# Patient Record
Sex: Female | Born: 1945 | Race: White | Hispanic: No | Marital: Married | Smoking: Never smoker
Health system: Southern US, Community
[De-identification: ages and names within clinical notes are randomized; demographics above are authoritative.]

## PROBLEM LIST (undated history)

## (undated) DIAGNOSIS — I10 Essential (primary) hypertension: Secondary | ICD-10-CM

## (undated) DIAGNOSIS — F329 Major depressive disorder, single episode, unspecified: Secondary | ICD-10-CM

## (undated) DIAGNOSIS — Z9889 Other specified postprocedural states: Secondary | ICD-10-CM

## (undated) DIAGNOSIS — E785 Hyperlipidemia, unspecified: Secondary | ICD-10-CM

## (undated) DIAGNOSIS — M1990 Unspecified osteoarthritis, unspecified site: Secondary | ICD-10-CM

## (undated) DIAGNOSIS — K589 Irritable bowel syndrome without diarrhea: Secondary | ICD-10-CM

## (undated) DIAGNOSIS — I739 Peripheral vascular disease, unspecified: Secondary | ICD-10-CM

## (undated) DIAGNOSIS — F419 Anxiety disorder, unspecified: Secondary | ICD-10-CM

## (undated) DIAGNOSIS — M4800 Spinal stenosis, site unspecified: Secondary | ICD-10-CM

## (undated) DIAGNOSIS — F32A Depression, unspecified: Secondary | ICD-10-CM

## (undated) DIAGNOSIS — K219 Gastro-esophageal reflux disease without esophagitis: Secondary | ICD-10-CM

## (undated) DIAGNOSIS — N3090 Cystitis, unspecified without hematuria: Secondary | ICD-10-CM

## (undated) DIAGNOSIS — R112 Nausea with vomiting, unspecified: Secondary | ICD-10-CM

## (undated) DIAGNOSIS — Z8719 Personal history of other diseases of the digestive system: Secondary | ICD-10-CM

## (undated) HISTORY — DX: Essential (primary) hypertension: I10

## (undated) HISTORY — DX: Major depressive disorder, single episode, unspecified: F32.9

## (undated) HISTORY — DX: Irritable bowel syndrome, unspecified: K58.9

## (undated) HISTORY — PX: CHOLECYSTECTOMY: SHX55

## (undated) HISTORY — DX: Hyperlipidemia, unspecified: E78.5

## (undated) HISTORY — DX: Gastro-esophageal reflux disease without esophagitis: K21.9

## (undated) HISTORY — DX: Depression, unspecified: F32.A

## (undated) HISTORY — DX: Cystitis, unspecified without hematuria: N30.90

## (undated) HISTORY — PX: FRACTURE SURGERY: SHX138

## (undated) HISTORY — DX: Unspecified osteoarthritis, unspecified site: M19.90

## (undated) HISTORY — DX: Spinal stenosis, site unspecified: M48.00

## (undated) HISTORY — PX: TONSILLECTOMY: SUR1361

---

## 1979-03-04 HISTORY — PX: ABDOMINAL HYSTERECTOMY: SHX81

## 1987-03-04 HISTORY — PX: CERVICAL SPINE SURGERY: SHX589

## 1992-03-03 HISTORY — PX: CERVICAL SPINE SURGERY: SHX589

## 1995-03-04 HISTORY — PX: BACK SURGERY: SHX140

## 1997-06-30 ENCOUNTER — Other Ambulatory Visit: Admission: RE | Admit: 1997-06-30 | Discharge: 1997-06-30 | Payer: Self-pay | Admitting: Internal Medicine

## 1998-03-03 HISTORY — PX: BACK SURGERY: SHX140

## 1998-11-29 ENCOUNTER — Ambulatory Visit (HOSPITAL_COMMUNITY): Admission: RE | Admit: 1998-11-29 | Discharge: 1998-11-29 | Payer: Self-pay | Admitting: Specialist

## 1998-11-29 ENCOUNTER — Encounter: Payer: Self-pay | Admitting: Specialist

## 1998-12-13 ENCOUNTER — Encounter: Payer: Self-pay | Admitting: Specialist

## 1998-12-13 ENCOUNTER — Ambulatory Visit (HOSPITAL_COMMUNITY): Admission: RE | Admit: 1998-12-13 | Discharge: 1998-12-13 | Payer: Self-pay | Admitting: Specialist

## 1999-01-04 ENCOUNTER — Encounter: Payer: Self-pay | Admitting: Specialist

## 1999-01-04 ENCOUNTER — Ambulatory Visit (HOSPITAL_COMMUNITY): Admission: RE | Admit: 1999-01-04 | Discharge: 1999-01-04 | Payer: Self-pay | Admitting: Specialist

## 1999-01-18 ENCOUNTER — Encounter: Payer: Self-pay | Admitting: Specialist

## 1999-01-22 ENCOUNTER — Observation Stay (HOSPITAL_COMMUNITY): Admission: RE | Admit: 1999-01-22 | Discharge: 1999-01-23 | Payer: Self-pay | Admitting: Specialist

## 1999-01-22 ENCOUNTER — Encounter: Payer: Self-pay | Admitting: Specialist

## 1999-04-29 ENCOUNTER — Emergency Department (HOSPITAL_COMMUNITY): Admission: EM | Admit: 1999-04-29 | Discharge: 1999-04-29 | Payer: Self-pay | Admitting: Emergency Medicine

## 1999-04-29 ENCOUNTER — Encounter: Payer: Self-pay | Admitting: Emergency Medicine

## 1999-06-20 ENCOUNTER — Encounter: Payer: Self-pay | Admitting: Internal Medicine

## 1999-06-20 ENCOUNTER — Encounter: Admission: RE | Admit: 1999-06-20 | Discharge: 1999-06-20 | Payer: Self-pay | Admitting: Internal Medicine

## 2000-03-03 HISTORY — PX: BACK SURGERY: SHX140

## 2000-10-02 ENCOUNTER — Ambulatory Visit (HOSPITAL_COMMUNITY): Admission: RE | Admit: 2000-10-02 | Discharge: 2000-10-02 | Payer: Self-pay | Admitting: Gastroenterology

## 2000-10-08 ENCOUNTER — Encounter: Admission: RE | Admit: 2000-10-08 | Discharge: 2000-10-08 | Payer: Self-pay | Admitting: Gastroenterology

## 2000-10-08 ENCOUNTER — Encounter: Payer: Self-pay | Admitting: Gastroenterology

## 2000-10-09 ENCOUNTER — Ambulatory Visit (HOSPITAL_COMMUNITY): Admission: RE | Admit: 2000-10-09 | Discharge: 2000-10-09 | Payer: Self-pay | Admitting: Gastroenterology

## 2000-10-23 ENCOUNTER — Encounter: Payer: Self-pay | Admitting: Internal Medicine

## 2000-10-23 ENCOUNTER — Encounter: Admission: RE | Admit: 2000-10-23 | Discharge: 2000-10-23 | Payer: Self-pay | Admitting: Internal Medicine

## 2001-02-10 ENCOUNTER — Encounter: Payer: Self-pay | Admitting: Specialist

## 2001-02-11 ENCOUNTER — Inpatient Hospital Stay (HOSPITAL_COMMUNITY): Admission: EM | Admit: 2001-02-11 | Discharge: 2001-02-12 | Payer: Self-pay | Admitting: Specialist

## 2001-03-03 HISTORY — PX: BACK SURGERY: SHX140

## 2001-05-26 ENCOUNTER — Encounter: Payer: Self-pay | Admitting: Specialist

## 2001-05-26 ENCOUNTER — Inpatient Hospital Stay (HOSPITAL_COMMUNITY): Admission: RE | Admit: 2001-05-26 | Discharge: 2001-06-01 | Payer: Self-pay | Admitting: Specialist

## 2001-06-08 ENCOUNTER — Inpatient Hospital Stay (HOSPITAL_COMMUNITY): Admission: EM | Admit: 2001-06-08 | Discharge: 2001-06-10 | Payer: Self-pay | Admitting: Emergency Medicine

## 2001-06-08 ENCOUNTER — Encounter: Payer: Self-pay | Admitting: Emergency Medicine

## 2001-06-09 ENCOUNTER — Encounter: Payer: Self-pay | Admitting: Internal Medicine

## 2001-10-27 ENCOUNTER — Encounter: Admission: RE | Admit: 2001-10-27 | Discharge: 2001-10-27 | Payer: Self-pay | Admitting: Internal Medicine

## 2001-10-27 ENCOUNTER — Encounter: Payer: Self-pay | Admitting: Internal Medicine

## 2002-04-28 ENCOUNTER — Ambulatory Visit (HOSPITAL_COMMUNITY): Admission: RE | Admit: 2002-04-28 | Discharge: 2002-04-28 | Payer: Self-pay | Admitting: Internal Medicine

## 2003-02-08 ENCOUNTER — Emergency Department (HOSPITAL_COMMUNITY): Admission: EM | Admit: 2003-02-08 | Discharge: 2003-02-09 | Payer: Self-pay | Admitting: Emergency Medicine

## 2006-03-03 HISTORY — PX: COLONOSCOPY: SHX174

## 2006-07-31 LAB — HM COLONOSCOPY

## 2009-03-03 HISTORY — PX: OTHER SURGICAL HISTORY: SHX169

## 2010-08-02 ENCOUNTER — Other Ambulatory Visit (INDEPENDENT_AMBULATORY_CARE_PROVIDER_SITE_OTHER): Payer: Self-pay | Admitting: General Surgery

## 2010-08-02 ENCOUNTER — Encounter (HOSPITAL_COMMUNITY)
Admission: RE | Admit: 2010-08-02 | Discharge: 2010-08-02 | Disposition: A | Discharge status: Home / Self Care | Payer: PRIVATE HEALTH INSURANCE | Source: Ambulatory Visit | Attending: General Surgery | Admitting: General Surgery

## 2010-08-02 ENCOUNTER — Ambulatory Visit (HOSPITAL_COMMUNITY)
Admission: RE | Admit: 2010-08-02 | Discharge: 2010-08-02 | Disposition: A | Discharge status: Home / Self Care | Payer: PRIVATE HEALTH INSURANCE | Source: Ambulatory Visit | Attending: General Surgery | Admitting: General Surgery

## 2010-08-02 DIAGNOSIS — Z01810 Encounter for preprocedural cardiovascular examination: Secondary | ICD-10-CM | POA: Insufficient documentation

## 2010-08-02 DIAGNOSIS — K8020 Calculus of gallbladder without cholecystitis without obstruction: Secondary | ICD-10-CM

## 2010-08-02 DIAGNOSIS — G4730 Sleep apnea, unspecified: Secondary | ICD-10-CM | POA: Insufficient documentation

## 2010-08-02 DIAGNOSIS — Z01812 Encounter for preprocedural laboratory examination: Secondary | ICD-10-CM | POA: Insufficient documentation

## 2010-08-02 DIAGNOSIS — Z01818 Encounter for other preprocedural examination: Secondary | ICD-10-CM | POA: Insufficient documentation

## 2010-08-02 LAB — DIFFERENTIAL
Lymphs Abs: 1.5 10*3/uL (ref 0.7–4.0)
Monocytes Relative: 7 % (ref 3–12)
Neutro Abs: 3.6 10*3/uL (ref 1.7–7.7)
Neutrophils Relative %: 66 % (ref 43–77)

## 2010-08-02 LAB — COMPREHENSIVE METABOLIC PANEL
AST: 23 U/L (ref 0–37)
BUN: 19 mg/dL (ref 6–23)
CO2: 32 mEq/L (ref 19–32)
Chloride: 96 mEq/L (ref 96–112)
Creatinine, Ser: 0.98 mg/dL (ref 0.4–1.2)
GFR calc non Af Amer: 57 mL/min — ABNORMAL LOW (ref >60)
Total Bilirubin: 0.5 mg/dL (ref 0.3–1.2)

## 2010-08-02 LAB — CBC
HCT: 35.7 % — ABNORMAL LOW (ref 36.0–46.0)
Hemoglobin: 12.8 g/dL (ref 12.0–15.0)
MCH: 31.8 pg (ref 26.0–34.0)
MCV: 88.8 fL (ref 78.0–100.0)
RBC: 4.02 MIL/uL (ref 3.87–5.11)

## 2010-08-02 LAB — SURGICAL PCR SCREEN: MRSA, PCR: NEGATIVE

## 2010-08-05 ENCOUNTER — Ambulatory Visit (HOSPITAL_COMMUNITY)
Admission: RE | Admit: 2010-08-05 | Discharge: 2010-08-05 | Disposition: A | Discharge status: Home / Self Care | Payer: PRIVATE HEALTH INSURANCE | Source: Ambulatory Visit | Attending: General Surgery | Admitting: General Surgery

## 2010-08-05 ENCOUNTER — Other Ambulatory Visit (INDEPENDENT_AMBULATORY_CARE_PROVIDER_SITE_OTHER): Payer: Self-pay | Admitting: General Surgery

## 2010-08-05 DIAGNOSIS — E785 Hyperlipidemia, unspecified: Secondary | ICD-10-CM | POA: Insufficient documentation

## 2010-08-05 DIAGNOSIS — F3289 Other specified depressive episodes: Secondary | ICD-10-CM | POA: Insufficient documentation

## 2010-08-05 DIAGNOSIS — Z79899 Other long term (current) drug therapy: Secondary | ICD-10-CM | POA: Insufficient documentation

## 2010-08-05 DIAGNOSIS — Z01812 Encounter for preprocedural laboratory examination: Secondary | ICD-10-CM | POA: Insufficient documentation

## 2010-08-05 DIAGNOSIS — IMO0002 Reserved for concepts with insufficient information to code with codable children: Secondary | ICD-10-CM | POA: Insufficient documentation

## 2010-08-05 DIAGNOSIS — F329 Major depressive disorder, single episode, unspecified: Secondary | ICD-10-CM | POA: Insufficient documentation

## 2010-08-05 DIAGNOSIS — Z01818 Encounter for other preprocedural examination: Secondary | ICD-10-CM | POA: Insufficient documentation

## 2010-08-05 DIAGNOSIS — K8010 Calculus of gallbladder with chronic cholecystitis without obstruction: Secondary | ICD-10-CM | POA: Insufficient documentation

## 2010-08-05 DIAGNOSIS — M129 Arthropathy, unspecified: Secondary | ICD-10-CM | POA: Insufficient documentation

## 2010-08-05 DIAGNOSIS — K589 Irritable bowel syndrome without diarrhea: Secondary | ICD-10-CM | POA: Insufficient documentation

## 2010-08-05 DIAGNOSIS — I10 Essential (primary) hypertension: Secondary | ICD-10-CM | POA: Insufficient documentation

## 2010-08-05 DIAGNOSIS — K219 Gastro-esophageal reflux disease without esophagitis: Secondary | ICD-10-CM | POA: Insufficient documentation

## 2010-08-05 DIAGNOSIS — Z7982 Long term (current) use of aspirin: Secondary | ICD-10-CM | POA: Insufficient documentation

## 2010-08-06 NOTE — Op Note (Signed)
[name], [name] NO.:  [id number]  MEDICAL RECORD NO.:  [id number]  LOCATION:  [facility]                         FACILITY:  [facility]  PHYSICIAN:  Juanetta Gosling, MDDATE OF BIRTH:  [birth year]  DATE OF PROCEDURE:  08/05/2010 DATE OF DISCHARGE:  08/05/2010                              OPERATIVE REPORT   PREOPERATIVE DIAGNOSIS:  Symptomatic cholelithiasis.  POSTOPERATIVE DIAGNOSIS:  Symptomatic cholelithiasis.  PROCEDURE:  Laparoscopic cholecystectomy.  SURGEON:  Juanetta Gosling, MD  ASSISTANT:  None.  ANESTHESIA:  General.  SUPERVISING ANESTHESIOLOGIST:  Zenon Mayo, MD  SPECIMENS:  Gallbladder contents to pathology.  ESTIMATED BLOOD LOSS:  Minimal.  COMPLICATIONS:  None.  DRAINS:  None.  DISPOSITION:  To recovery room in stable condition.  INDICATIONS:  This is a 65 year old female who recently on vacation on Mother's Day weekend she ate some [place] food and shortly after this developed some right upper quadrant pain.  She was evaluated at [facility] and at that point had epigastric tenderness with white blood cell count of 13, normal liver function tests except for an AST of 52.  Ultrasound which I reviewed the results of multiple mobile gallstones, no pericholecystic fluid, gallbladder wall thickening or sonographic Murphy sign.  She has continued to have attacks since then I saw her at the end of May we discussed laparoscopic cholecystectomy and the risks and benefits associated with that procedure.    After informed consent was obtained, the patient was taken to the operating room.  She was administered 1 g of intravenous cefazolin.  Sequential compression devices were placed.  Prior to induction of anesthesia, she was then placed under general anesthesia without complication.  Her abdomen was prepped and draped in standard sterile surgical fashion.  A surgical time-out was then performed.  I infiltrated 0.25% Marcaine below her  umbilicus.  I made in a vertical incision with an 11 blade and carried this down to her fascia.  Her fascia was entered sharply.  Her peritoneum was entered bluntly.  I placed a 0 Vicryl pursestring suture through the fascia and Hasson trocar was then introduced and the abdomen was insufflated to 15  mmHg pressure.  Three further 5-mm ports were placed in the epigastrium in right side of the abdomen under direct vision after infiltration with local anesthetic without complications.  Her gallbladder was then retracted cephalad.  Her duodenum and omentum were noted to be adherent to her gallbladder.  This was taken down bluntly without injury.  Her gallbladder was then retracted cephalad and lateral.  I dissected the triangle of Calot.  The critical view of safety was obtained.  I could see the common bile duct throughout its entirety as well.  I then clipped the duct three times, divided this.  I treated the artery in a similar fashion.  The gallbladder was then removed from the liver bed without any spillage.  This was placed in an EndoCatch bag and removed from the umbilicus.  I then obtained hemostasis.  Irrigation was performed.  I then removed the Hasson trocar.  I tied this down obliterating the defect.  I viewed this from the epigastric port.  There was no evidence of an entry  injury.  I then desufflated the abdomen and removed all trocars.  These were closed with 4-0 Monocryl.  Dermabond was placed over this.  She tolerated this well, was extubated in the operating room and transferred to the recovery room in stable condition.     Juanetta Gosling, MD     MCW/MEDQ  D:  08/05/2010  T:  08/06/2010  Job:  [id number]  cc:   Larina Earthly, M.D.  Electronically Signed by Emelia Loron MD on 08/06/2010 03:27:32 PM

## 2010-08-15 ENCOUNTER — Encounter (INDEPENDENT_AMBULATORY_CARE_PROVIDER_SITE_OTHER): Payer: Self-pay | Admitting: General Surgery

## 2010-08-26 ENCOUNTER — Ambulatory Visit (INDEPENDENT_AMBULATORY_CARE_PROVIDER_SITE_OTHER): Payer: PRIVATE HEALTH INSURANCE | Admitting: General Surgery

## 2010-08-26 ENCOUNTER — Encounter (INDEPENDENT_AMBULATORY_CARE_PROVIDER_SITE_OTHER): Payer: Self-pay | Admitting: General Surgery

## 2010-08-26 DIAGNOSIS — Z09 Encounter for follow-up examination after completed treatment for conditions other than malignant neoplasm: Secondary | ICD-10-CM

## 2010-08-26 NOTE — Progress Notes (Signed)
Subjective:     Patient ID: Shelia [name], female   DOB: [birth year], 65 y.o.   MRN: [id number]    There were no vitals taken for this visit.    HPIThis is a 65 year old female who I saw at the end of May for symptomatic cholelithiasis. She has since undergone a laparoscopic cholecystectomy on August 05, 2010. She reports no problems postoperatively. She's been advancing her diet without any difficulty. She reports no recurrence of her prior pain. She's been having normal bowel movements after having some constipation postoperatively.Pathology shows chronic cholecystitis and cholelithiasis   Review of Systems     Objective:   Physical ExamHer laparoscopic incisions are all well healed without any evidence of infection     Assessment:    s/p lap chole for symptomatic cholelithiasis   Plan:     Resume regular diet. No activity restrictions. Return to see me as needed

## 2012-05-20 ENCOUNTER — Other Ambulatory Visit: Payer: Self-pay | Admitting: Nurse Practitioner

## 2012-06-17 ENCOUNTER — Ambulatory Visit: Payer: Self-pay | Admitting: Nurse Practitioner

## 2012-06-30 ENCOUNTER — Encounter: Payer: Self-pay | Admitting: *Deleted

## 2012-07-02 ENCOUNTER — Ambulatory Visit: Payer: Self-pay | Admitting: Nurse Practitioner

## 2012-07-30 ENCOUNTER — Telehealth: Payer: Self-pay | Admitting: Nurse Practitioner

## 2012-07-30 ENCOUNTER — Ambulatory Visit: Payer: Self-pay | Admitting: Nurse Practitioner

## 2012-07-30 NOTE — Telephone Encounter (Signed)
Patients daughter has recently been dx with a rare condition and her parents need to be checked to find out what their levels are because they are carriers. She needs her Ferritin  levels checked needs to find out if we will do this and if she needs to be fasting.

## 2012-07-30 NOTE — Telephone Encounter (Signed)
SEE NOTE BELOW AND CHART IN YOUR OFFICE. PER ASHLEY DO NOT NEED TO BE FASTING FOR THIS. PLEASE ADVISE AND WILL LET PATIENT KNOW. SUE

## 2012-07-31 NOTE — Telephone Encounter (Signed)
Of course we can check this.  She does not need to be fasting.  Does she know the name of the condition so I can be sure nothing else needs to be checked?  I will enter orders with response.

## 2012-08-02 NOTE — Telephone Encounter (Signed)
Spoke with pt who reports her daughter has hemachromatosis, or too much iron in her blood. Pt told she can have labwork drawn at her [facility] and she does not need to fast. Pt has appt 08-17-12. Pt agreeable.

## 2012-08-17 ENCOUNTER — Ambulatory Visit (INDEPENDENT_AMBULATORY_CARE_PROVIDER_SITE_OTHER): Payer: PRIVATE HEALTH INSURANCE | Admitting: Nurse Practitioner

## 2012-08-17 ENCOUNTER — Encounter: Payer: Self-pay | Admitting: Nurse Practitioner

## 2012-08-17 VITALS — BP 102/68 | HR 70 | Resp 16 | Ht 67.0 in | Wt 138.6 lb

## 2012-08-17 DIAGNOSIS — Z0000 Encounter for general adult medical examination without abnormal findings: Secondary | ICD-10-CM

## 2012-08-17 DIAGNOSIS — Z01419 Encounter for gynecological examination (general) (routine) without abnormal findings: Secondary | ICD-10-CM

## 2012-08-17 DIAGNOSIS — Z23 Encounter for immunization: Secondary | ICD-10-CM

## 2012-08-17 LAB — POCT URINALYSIS DIPSTICK
Bilirubin, UA: NEGATIVE
Blood, UA: NEGATIVE
Nitrite, UA: NEGATIVE

## 2012-08-17 MED ORDER — ESTROGENS CONJUGATED 0.3 MG PO TABS: 0.3000 mg | ORAL_TABLET | Freq: Every day | ORAL | Status: DC

## 2012-08-17 MED ORDER — ESTROGENS, CONJUGATED 0.625 MG/GM VA CREA: TOPICAL_CREAM | VAGINAL | Status: DC

## 2012-08-17 NOTE — Progress Notes (Signed)
67 y.o. G2P2 Married Caucasian Fe here for annual exam.  Premarin vaginal cream has really helped with dyspareunia.  Daughter age 103 new diagnosis with hemachromatosis.  No LMP recorded. Patient has had a hysterectomy.          Sexually active: yes  The current method of family planning is post menopausal status.    Exercising: yes  Home exercise routine includes walking 1 mile daily hrs per day. Smoker:  no  Health Maintenance: Pap:  2013 MMG:  08/03/12 normal Colonoscopy:  07/2006   due for this year BMD:  09/2006 scheduled for September TD:  8/04;   Needs TDaP today Labs: Hgb-13.3, urine today    reports that she has never smoked. She does not have any smokeless tobacco history on file. She reports that she does not drink alcohol or use illicit drugs.  Past Medical History  Diagnosis Date  . Hypertension   . DJD (degenerative joint disease)   . Depression   . GERD (gastroesophageal reflux disease)   . Hyperlipidemia   . Irritable bowel syndrome (IBS)   . Cystitis   . Spinal stenosis     Past Surgical History  Procedure Laterality Date  . Cervical spine surgery  1989    c-4,c-5  . Cervical spine surgery  1994    c-4,c-5  . Back surgery  1997    l-5,s-1  . Back surgery  2000    l-5,s-1  . Back surgery  2002    l-5,s-1  . Back surgery  2003    l-5,s-2  . Hemorrhoid banding  2011  . Cholecystectomy      lap  . Abdominal hysterectomy  1981    TVH  . Colonoscopy  2008    polyps    Current Outpatient Prescriptions  Medication Sig Dispense Refill  . aspirin 81 MG tablet Take 81 mg by mouth daily.      . celecoxib (CELEBREX) 200 MG capsule Take 200 mg by mouth 2 (two) times daily.        . Cetirizine HCl (ZYRTEC ALLERGY PO) Take by mouth as needed.      . conjugated estrogens (PREMARIN) vaginal cream Place vaginally 2 (two) times a week.  42.5 g  3  . fish oil-omega-3 fatty acids 1000 MG capsule Take 2 g by mouth daily.        Marland Kitchen losartan-hydrochlorothiazide (HYZAAR)  100-25 MG per tablet Take 1 tablet by mouth daily.        . Meth-Hyo-M Bl-Na Phos-Ph Sal (URIBEL) 118 MG CAPS Take by mouth.        . metoprolol-hydrochlorothiazide (LOPRESSOR HCT) 100-25 MG per tablet Take 1 tablet by mouth daily.      . Multiple Vitamins-Minerals (MULTIVITAMIN PO) Take by mouth daily.      . potassium chloride SA (K-DUR,KLOR-CON) 20 MEQ tablet Take 20 mEq by mouth 2 (two) times daily.        . Throat Lozenges (ZINC + VITAMIN C MT) Use as directed in the mouth or throat daily.      Marland Kitchen triamterene-hydrochlorothiazide (DYAZIDE) 37.5-25 MG per capsule Take 1 capsule by mouth every morning.      . trimethoprim (TRIMPEX) 100 MG tablet Take 100 mg by mouth 2 (two) times daily.        Marland Kitchen zolpidem (AMBIEN) 10 MG tablet Take 10 mg by mouth at bedtime as needed.        Marland Kitchen estrogens, conjugated, (PREMARIN) 0.3 MG tablet Take 1 tablet (  0.3 mg total) by mouth daily. Take daily for 21 days then do not take for 7 days.  90 tablet  3   No current facility-administered medications for this visit.    Family History  Problem Relation Age of Onset  . Heart disease Mother   . Heart disease Father     ROS:  Pertinent items are noted in HPI.  Otherwise, a comprehensive ROS was negative.  Exam:   BP 102/68  Pulse 70  Resp 16  Ht 5\' 7"  (1.702 m)  Wt 138 lb 9.6 oz (62.869 kg)  BMI 21.7 kg/m2 Height: 5\' 7"  (170.2 cm)  Ht Readings from Last 3 Encounters:  08/17/12 5\' 7"  (1.702 m)    General appearance: alert, cooperative and appears stated age Head: Normocephalic, without obvious abnormality, atraumatic Neck: no adenopathy, supple, symmetrical, trachea midline and thyroid normal to inspection and palpation Lungs: clear to auscultation bilaterally Breasts: normal appearance, no masses or tenderness Heart: regular rate and rhythm Abdomen: soft, non-tender; no masses,  no organomegaly Extremities: extremities normal, atraumatic, no cyanosis or edema Skin: Skin color, texture, turgor normal.  No rashes or lesions Lymph nodes: Cervical, supraclavicular, and axillary nodes normal. No abnormal inguinal nodes palpated Neurologic: Grossly normal   Pelvic: External genitalia:  no lesions              Urethra:  normal appearing urethra with no masses, tenderness or lesions              Bartholin's and Skene's: normal                 Vagina: atrophic appearing vagina with pale color and no discharge, no lesions              Cervix: absent              Pap taken: no Bimanual Exam:  Uterus:  uterus absent              Adnexa: no mass, fullness, tenderness               Rectovaginal: Confirms               Anus:  normal sphincter tone, no lesions  A:  Well Woman with normal exam  S/P TVH with AP repair 1981 secondary to AUB  History of spinal stenosis  Update TDaP today  Daughter with new diagnosis of hemochromatosis  P:   Pap smear as per guidelines   Mammogram is scheduled for this month  TDaP given today  Refill Premarin tabs and vaginal cream  Discussed potential risks of HRT including DVT, CVA, and cancer, etc.   She will consult with PCP about labs she needs for hemochromatosis  counseled on breast self exam, adequate intake of calcium and vitamin D,   diet and exercise, Kegel's exercises return annually or prn  An After Visit Summary was printed and given to the patient.

## 2012-08-17 NOTE — Patient Instructions (Addendum)

## 2012-08-18 ENCOUNTER — Telehealth: Payer: Self-pay | Admitting: Nurse Practitioner

## 2012-08-18 NOTE — Telephone Encounter (Signed)
RX written in error per patient. Was written for Premarin and this costs too much. Pateint wants Estrace 42.5 is what she normally takes and copay is $25. [employer]

## 2012-08-18 NOTE — Telephone Encounter (Signed)
Please advise 

## 2012-08-19 NOTE — Progress Notes (Signed)
Encounter reviewed by Dr. Brook Silva.  

## 2012-08-19 NOTE — Telephone Encounter (Signed)
Pt states that she has used the Estrace cream in the past. Pt states there is a $25.00 difference between the two meds. Please advise.

## 2012-08-19 NOTE — Telephone Encounter (Signed)
Per all her notes on epic with medication history for past 3 years Premarin cream is listed.  Is she requesting change due to cost, because they all are about the same

## 2012-08-19 NOTE — Telephone Encounter (Signed)
LVM for pt to return my call in regards to med refill/change.

## 2012-08-20 NOTE — Telephone Encounter (Signed)
Unable to find chart.  Pt would like to change due to cost.  Please advise if OK to change to Estrace.

## 2012-08-22 NOTE — Telephone Encounter (Signed)
Ok to change

## 2012-08-23 MED ORDER — ESTRADIOL 0.1 MG/GM VA CREA: TOPICAL_CREAM | VAGINAL | Status: DC

## 2012-08-23 NOTE — Telephone Encounter (Signed)
RX for Estrace Cream sent.

## 2012-10-06 ENCOUNTER — Other Ambulatory Visit: Payer: Self-pay

## 2012-12-14 ENCOUNTER — Other Ambulatory Visit: Payer: Self-pay | Admitting: Nurse Practitioner

## 2013-01-06 ENCOUNTER — Other Ambulatory Visit: Payer: Self-pay

## 2013-01-24 ENCOUNTER — Telehealth: Payer: Self-pay | Admitting: Nurse Practitioner

## 2013-01-24 NOTE — Telephone Encounter (Signed)
Spoke with pt to advise her to go to [employer] and print out medication Tier info so Shelia [name] will know what might be a cheaper alternative for her. Pt will fax to [place] or she will call company to get recommendations.

## 2013-01-24 NOTE — Telephone Encounter (Signed)
Patient calling re: Estrace to tier 3 as of 03/03/13. Patient is calling for lower-cost alternatives but the insurance company did not recommend any.

## 2013-01-25 NOTE — Telephone Encounter (Signed)
Both of these is not a good option for her.  The transdermal is just estrogen but the dose is higher than what she is on.  The other is a combination of estrogen and progesterone which is also higher than her dose and she doesn't need both hormones -  because she had a hysterectomy.  My best advise is to taper off of her current therapy and then discontinue.  We also discussed this at [facility] - that is was time to maybe come off ERT.

## 2013-01-25 NOTE — Telephone Encounter (Signed)
Spoke with pt who has info on two different Tier 1 options. Estradiol transdermal and estradiol norethindrone accet. are both Tier 1. Pt didn't know if either would be right for her. Please advise.

## 2013-01-26 NOTE — Telephone Encounter (Signed)
Spoke with patient. She is agreeable to plan and will discuss further with Lauro Franklin, FNP at [facility].

## 2013-07-05 ENCOUNTER — Encounter (INDEPENDENT_AMBULATORY_CARE_PROVIDER_SITE_OTHER): Payer: Self-pay | Admitting: General Surgery

## 2013-07-05 ENCOUNTER — Ambulatory Visit (INDEPENDENT_AMBULATORY_CARE_PROVIDER_SITE_OTHER): Payer: No Typology Code available for payment source | Admitting: General Surgery

## 2013-07-05 VITALS — BP 124/80 | HR 76 | Temp 97.8°F | Ht 68.0 in | Wt 141.6 lb

## 2013-07-05 DIAGNOSIS — D171 Benign lipomatous neoplasm of skin and subcutaneous tissue of trunk: Secondary | ICD-10-CM

## 2013-07-05 DIAGNOSIS — D1779 Benign lipomatous neoplasm of other sites: Secondary | ICD-10-CM

## 2013-07-05 NOTE — Progress Notes (Signed)
Subjective:     Patient ID: Shelia [name], female   DOB: 1945-03-15, 68 y.o.   MRN: [id number]  HPI A 68 year old female on a well from a cholecystectomy. Over the last several months she has noted a cyst on her left flank. This is somewhat irritating to her. There's been no change in size in the last 6 months. She has no history of any infection the drainage associated with this. She was seen by her primary care physician he comes in today to have this evaluated.  Review of Systems     Objective:   Physical Exam 3 cm mobile nontender soft mass left flank c/w lipoma    Assessment:     Left flank lipoma     Plan:     Clinically this appears to be a lipoma. It has no concerning features. I told her I think does not need to be removed unless really symptomatic or period of rapid growth neither of which are true right now.  She will call me back if this is concerning.

## 2013-07-11 ENCOUNTER — Encounter: Payer: Self-pay | Admitting: Nurse Practitioner

## 2013-08-18 ENCOUNTER — Ambulatory Visit: Payer: PRIVATE HEALTH INSURANCE | Admitting: Nurse Practitioner

## 2013-09-01 ENCOUNTER — Ambulatory Visit (INDEPENDENT_AMBULATORY_CARE_PROVIDER_SITE_OTHER): Payer: No Typology Code available for payment source | Admitting: Nurse Practitioner

## 2013-09-01 ENCOUNTER — Encounter: Payer: Self-pay | Admitting: Nurse Practitioner

## 2013-09-01 VITALS — BP 110/60 | HR 68 | Resp 16 | Ht 67.0 in | Wt 140.0 lb

## 2013-09-01 DIAGNOSIS — Z0000 Encounter for general adult medical examination without abnormal findings: Secondary | ICD-10-CM

## 2013-09-01 DIAGNOSIS — N949 Unspecified condition associated with female genital organs and menstrual cycle: Secondary | ICD-10-CM

## 2013-09-01 DIAGNOSIS — Z01419 Encounter for gynecological examination (general) (routine) without abnormal findings: Secondary | ICD-10-CM

## 2013-09-01 DIAGNOSIS — N3090 Cystitis, unspecified without hematuria: Secondary | ICD-10-CM

## 2013-09-01 DIAGNOSIS — R102 Pelvic and perineal pain: Secondary | ICD-10-CM

## 2013-09-01 LAB — POCT URINALYSIS DIPSTICK
Bilirubin, UA: NEGATIVE
Glucose, UA: NEGATIVE
Ketones, UA: NEGATIVE
NITRITE UA: NEGATIVE
PH UA: 5
Protein, UA: NEGATIVE
UROBILINOGEN UA: NEGATIVE

## 2013-09-01 MED ORDER — URIBEL 118 MG PO CAPS: 118.0000 mg | ORAL_CAPSULE | Freq: Two times a day (BID) | ORAL | Status: DC

## 2013-09-01 MED ORDER — ESTROGENS CONJUGATED 0.3 MG PO TABS: ORAL_TABLET | ORAL | Status: DC

## 2013-09-01 MED ORDER — NITROFURANTOIN MONOHYD MACRO 100 MG PO CAPS: 100.0000 mg | ORAL_CAPSULE | Freq: Two times a day (BID) | ORAL | Status: DC

## 2013-09-01 NOTE — Progress Notes (Signed)
68 y.o. G2P2 Married Caucasian Fe here for annual exam.  Recently noted an uncomfortable area on the left vulva and vaginal wall internally with SA.  Discomfort for about 3 months.  Does not seem to be related to dryness as she is using lubrication.  Now is having the symptoms of 'swelling' externally and has to remove underwear when she gets home to get relief.  She has at times noted this with sitting down.  Symptoms seem to be within the vagina and radiates to the external left labia and groin.  Denies any urinary or bowel changes and no relief with habits.  Patient's last menstrual period was 08/02/1979.          Sexually active: Yes The current method of family planning is status post hysterectomy secondary to endometriosis  Exercising: Yes.    Walking daily Smoker:  no  Health Maintenance: Pap:  2013 - Normal MMG:  08/2013 - Normal - per pt Colonoscopy:  07/2006 normal - repeat in 5 years BMD:   2008 will get at Dr. Radene Gunning TDaP:  08/2012 Shingles vaccine: not done Labs: PCP   UA: RBC= Small, WBC=Trace.   reports that she has never smoked. She has never used smokeless tobacco. She reports that she does not drink alcohol or use illicit drugs.  Past Medical History  Diagnosis Date  . Hypertension   . DJD (degenerative joint disease)   . Depression   . GERD (gastroesophageal reflux disease)   . Hyperlipidemia   . Irritable bowel syndrome (IBS)   . Cystitis   . Spinal stenosis     Past Surgical History  Procedure Laterality Date  . Cervical spine surgery  1989    c-4,c-5  . Cervical spine surgery  1994    c-4,c-5  . Back surgery  1997    l-5,s-1  . Back surgery  2000    l-5,s-1  . Back surgery  2002    l-5,s-1  . Back surgery  2003    l-5,s-2  . Hemorrhoid banding  2011  . Cholecystectomy      lap  . Abdominal hysterectomy  1981    TVH  . Colonoscopy  2008    polyps    Current Outpatient Prescriptions  Medication Sig Dispense Refill  . aspirin 81 MG tablet Take 81  mg by mouth daily.      . celecoxib (CELEBREX) 200 MG capsule Take 200 mg by mouth 2 (two) times daily.        . cholecalciferol (VITAMIN D) 1000 UNITS tablet Take 1,000 Units by mouth daily.      Marland Kitchen estrogens, conjugated, (PREMARIN) 0.3 MG tablet Take daily  90 tablet  3  . fish oil-omega-3 fatty acids 1000 MG capsule Take 2 g by mouth daily.        Marland Kitchen losartan-hydrochlorothiazide (HYZAAR) 100-25 MG per tablet Take 1 tablet by mouth daily.        . Meth-Hyo-M Bl-Na Phos-Ph Sal (URIBEL) 118 MG CAPS Take 1 capsule (118 mg total) by mouth 2 (two) times daily.  180 capsule  3  . Multiple Vitamins-Minerals (MULTIVITAMIN PO) Take by mouth daily.      . potassium chloride SA (K-DUR,KLOR-CON) 20 MEQ tablet Take 20 mEq by mouth 2 (two) times daily.        . Throat Lozenges (ZINC + VITAMIN C MT) Use as directed in the mouth or throat daily.      Marland Kitchen triamterene-hydrochlorothiazide (DYAZIDE) 37.5-25 MG per capsule Take 1  capsule by mouth every morning.      . zolpidem (AMBIEN) 10 MG tablet Take 10 mg by mouth at bedtime as needed.        . nitrofurantoin, macrocrystal-monohydrate, (MACROBID) 100 MG capsule Take 1 capsule (100 mg total) by mouth 2 (two) times daily.  14 capsule  0   No current facility-administered medications for this visit.    Family History  Problem Relation Age of Onset  . Heart disease Mother   . Heart disease Father   . Hemochromatosis Daughter     ROS:  Pertinent items are noted in HPI.  Otherwise, a comprehensive ROS was negative.  Exam:   BP 110/60  Pulse 68  Resp 16  Ht 5\' 7"  (1.702 m)  Wt 140 lb (63.504 kg)  BMI 21.92 kg/m2  LMP 08/02/1979 Height: 5\' 7"  (170.2 cm)  Ht Readings from Last 3 Encounters:  09/01/13 5\' 7"  (1.702 m)  07/05/13 5\' 8"  (1.727 m)  08/17/12 5\' 7"  (1.702 m)    General appearance: alert, cooperative and appears stated age Head: Normocephalic, without obvious abnormality, atraumatic Neck: no adenopathy, supple, symmetrical, trachea midline and  thyroid normal to inspection and palpation Lungs: clear to auscultation bilaterally Breasts: normal appearance, no masses or tenderness Heart: regular rate and rhythm Abdomen: soft, non-tender; no masses,  no organomegaly Extremities: extremities normal, atraumatic, no cyanosis or edema Skin: Skin color, texture, turgor normal. No rashes or lesions Lymph nodes: Cervical, supraclavicular, and axillary nodes normal. No abnormal inguinal nodes palpated Neurologic: Grossly normal   Pelvic: External genitalia:  no lesions, there is a small vein in the groin but not large or tender.              Urethra:  normal appearing urethra with no masses, tenderness or lesions              Bartholin's and Skene's: normal                 Vagina: normal appearing vagina with normal color and discharge, no lesions, unable to illicit same pain but she admits it feels different.              Cervix: absent              Pap taken: No. Bimanual Exam:  Uterus:  uterus absent              Adnexa: no mass, fullness, tenderness               Rectovaginal: Confirms               Anus:  normal sphincter tone, no lesions  A:  Well Woman with normal exam  Postmenopausal on ERT  S/P TVH with A&P repair 1981 secondary to AUB  Left vaginal wall pain ? Etiology  R/O UTI  P:   Reviewed health and wellness pertinent to exam  Pap smear not taken today  Mammogram is due 6/16  Follow with [facility], started on Macrobid 100 mg BID #14  Refill Premarin 0.3 mg for a year  Counseled on risk with DVT, CVA, cancer, etc  Will get PUS and follow looking closely at left sidewall and ovary  Counseled on breast self exam, mammography screening, use and side effects of HRT, adequate intake of calcium and vitamin D, diet and exercise, Kegel's exercises return annually or prn  An After Visit Summary was printed and given to the patient.

## 2013-09-01 NOTE — Patient Instructions (Addendum)

## 2013-09-02 LAB — URINALYSIS, MICROSCOPIC ONLY
Bacteria, UA: NONE SEEN
Casts: NONE SEEN
Crystals: NONE SEEN

## 2013-09-03 LAB — URINE CULTURE
Colony Count: NO GROWTH
Organism ID, Bacteria: NO GROWTH

## 2013-09-06 NOTE — Progress Notes (Signed)
Encounter reviewed by Dr. Brook Silva.  

## 2013-09-07 ENCOUNTER — Telehealth: Payer: Self-pay | Admitting: Obstetrics and Gynecology

## 2013-09-07 NOTE — Telephone Encounter (Signed)
Left message for patient to call back. Need to go over benefits and schedule PUS °

## 2013-09-07 NOTE — Telephone Encounter (Signed)
Spoke with patient. Advised that per benefit quote received, she will be responsible for $50 copay for PUS. Patient agreeable. Scheduled PUS. Advised patient of 72 hour cancellation policy and $080 cancellation fee. Patient agreeable.

## 2013-09-22 ENCOUNTER — Ambulatory Visit (INDEPENDENT_AMBULATORY_CARE_PROVIDER_SITE_OTHER): Payer: No Typology Code available for payment source

## 2013-09-22 ENCOUNTER — Encounter: Payer: Self-pay | Admitting: Obstetrics and Gynecology

## 2013-09-22 ENCOUNTER — Ambulatory Visit (INDEPENDENT_AMBULATORY_CARE_PROVIDER_SITE_OTHER): Payer: No Typology Code available for payment source | Admitting: Obstetrics and Gynecology

## 2013-09-22 VITALS — BP 124/80 | Wt 144.0 lb

## 2013-09-22 DIAGNOSIS — Z1211 Encounter for screening for malignant neoplasm of colon: Secondary | ICD-10-CM

## 2013-09-22 DIAGNOSIS — N949 Unspecified condition associated with female genital organs and menstrual cycle: Secondary | ICD-10-CM

## 2013-09-22 DIAGNOSIS — K649 Unspecified hemorrhoids: Secondary | ICD-10-CM

## 2013-09-22 DIAGNOSIS — R1032 Left lower quadrant pain: Secondary | ICD-10-CM

## 2013-09-22 DIAGNOSIS — R102 Pelvic and perineal pain: Secondary | ICD-10-CM

## 2013-09-22 DIAGNOSIS — R109 Unspecified abdominal pain: Secondary | ICD-10-CM

## 2013-09-22 MED ORDER — ESTROGENS, CONJUGATED 0.625 MG/GM VA CREA: 1.0000 | TOPICAL_CREAM | Freq: Every day | VAGINAL | Status: DC

## 2013-09-22 NOTE — Patient Instructions (Signed)
Please contact us back if you have continued pain after seeing Dr. Collene Mares and your surgeon.

## 2013-09-22 NOTE — Progress Notes (Signed)
Subjective  Patient is here today for pelvic ultrasound.   Status post total vaginal hysterectomy with anterior and posterior colporrhapy for endometriosis and prolapse.   Having vaginal pain radiating to the left groin.  Painful intercourse.  Has used vaginal estrogen cream but it is not paid by insurance.  Wants to try Premarin cream again with an online coupon.   Had mammogram in June - normal.  History of spinal stenosis and multiple prior back surgeries.   History of IBS and cystitis. Has hemorrhoids.  Objective  Pelvic ultrasound - Images and reports reviewed with patient.   Absent uterus.  Bilateral ovaries normal..  No free fluid.     Assessment  Vaginal and Left groin pain of unclear etiology. History of IBS. Atrophic vaginitis.  History of spinal stenosis and multiple surgeries.   Plan  Will refer to Gastroenterology to evaluate for IBS and potential bowel/rectal cause of vaginal/groin pain.  Possibly due for colonoscopy. Rx for Premarin vaginal cream.  See EPIC orders.  Reviewed risks of breast cancer, DVT, PE, MI, stroke.  If GI evaluation is negative, I recommend patient follow up with her neurosurgeon.   15 minutes face to face time of which over 50% was spent in counseling.   After visit summary to patient.

## 2013-09-22 NOTE — Addendum Note (Signed)
Addended by: Michele Mcalpine on: 09/22/2013 11:56 AM   Modules accepted: Orders

## 2013-10-27 ENCOUNTER — Other Ambulatory Visit: Payer: Self-pay | Admitting: Dermatology

## 2014-01-02 ENCOUNTER — Encounter: Payer: Self-pay | Admitting: Obstetrics and Gynecology

## 2014-06-22 ENCOUNTER — Other Ambulatory Visit: Payer: Self-pay | Admitting: Nurse Practitioner

## 2014-06-22 NOTE — Telephone Encounter (Signed)
09/01/13 #90/3 rfs [facility] [address] [facility] and s/w patient she is wanting to get [facility]ills [facility] [address]. Told patient that she can [facility] and have them transfer her remaining refills to her pharmacy of choice. Patient is agreeable and will call us if she has any problems or issues.  Rx Denied through our system with a message to the pharmacy that patient will have rx transferred.  Routed to provider for review, encounter closed.

## 2014-09-05 ENCOUNTER — Ambulatory Visit: Payer: No Typology Code available for payment source | Admitting: Nurse Practitioner

## 2014-09-14 ENCOUNTER — Other Ambulatory Visit: Payer: Self-pay | Admitting: Nurse Practitioner

## 2014-09-15 NOTE — Telephone Encounter (Signed)
Medication refill request: premarin 0.3mg  Last [facility]:  09-01-13 Next [facility]: 11-01-14 Last MMG (if hormonal medication request): last mammo was 08-17-14 Refill authorized: pt was given 0.3mg  tabs at [facility] & saw Dr Quincy Simmonds that same month & was given rx for premarin vaginal cream. If pt is suppose to be using both please approve rx. Pt was called to see if she had recent mammo. Pt states she had one done 6/16. I called [facility] & had them fax mammo over. The report will be in your door for review.

## 2014-09-15 NOTE — Telephone Encounter (Signed)
Yes she is to have both oral and vaginal estrogen.

## 2014-11-01 ENCOUNTER — Ambulatory Visit: Payer: No Typology Code available for payment source | Admitting: Nurse Practitioner

## 2014-12-27 ENCOUNTER — Ambulatory Visit: Payer: No Typology Code available for payment source | Admitting: Nurse Practitioner

## 2015-03-09 ENCOUNTER — Ambulatory Visit: Payer: No Typology Code available for payment source | Admitting: Nurse Practitioner

## 2015-06-22 ENCOUNTER — Ambulatory Visit: Payer: No Typology Code available for payment source | Admitting: Nurse Practitioner

## 2015-08-29 DIAGNOSIS — Z1231 Encounter for screening mammogram for malignant neoplasm of breast: Secondary | ICD-10-CM | POA: Diagnosis not present

## 2015-08-31 ENCOUNTER — Ambulatory Visit: Payer: No Typology Code available for payment source | Admitting: Nurse Practitioner

## 2015-09-10 ENCOUNTER — Encounter: Payer: Self-pay | Admitting: Nurse Practitioner

## 2015-11-09 ENCOUNTER — Ambulatory Visit: Payer: No Typology Code available for payment source | Admitting: Nurse Practitioner

## 2015-12-09 DIAGNOSIS — Z23 Encounter for immunization: Secondary | ICD-10-CM | POA: Diagnosis not present

## 2015-12-28 ENCOUNTER — Ambulatory Visit: Payer: No Typology Code available for payment source | Admitting: Nurse Practitioner

## 2016-01-29 DIAGNOSIS — E784 Other hyperlipidemia: Secondary | ICD-10-CM | POA: Diagnosis not present

## 2016-01-29 DIAGNOSIS — N3090 Cystitis, unspecified without hematuria: Secondary | ICD-10-CM | POA: Diagnosis not present

## 2016-01-29 DIAGNOSIS — I10 Essential (primary) hypertension: Secondary | ICD-10-CM | POA: Diagnosis not present

## 2016-01-29 DIAGNOSIS — K219 Gastro-esophageal reflux disease without esophagitis: Secondary | ICD-10-CM | POA: Diagnosis not present

## 2016-01-29 DIAGNOSIS — J309 Allergic rhinitis, unspecified: Secondary | ICD-10-CM | POA: Diagnosis not present

## 2016-01-29 DIAGNOSIS — E785 Hyperlipidemia, unspecified: Secondary | ICD-10-CM | POA: Diagnosis not present

## 2016-01-29 DIAGNOSIS — I872 Venous insufficiency (chronic) (peripheral): Secondary | ICD-10-CM | POA: Diagnosis not present

## 2016-01-29 DIAGNOSIS — Z0000 Encounter for general adult medical examination without abnormal findings: Secondary | ICD-10-CM | POA: Diagnosis not present

## 2016-01-29 DIAGNOSIS — G4700 Insomnia, unspecified: Secondary | ICD-10-CM | POA: Diagnosis not present

## 2016-02-05 DIAGNOSIS — Z23 Encounter for immunization: Secondary | ICD-10-CM | POA: Diagnosis not present

## 2016-02-05 DIAGNOSIS — I872 Venous insufficiency (chronic) (peripheral): Secondary | ICD-10-CM | POA: Diagnosis not present

## 2016-02-05 DIAGNOSIS — Z1389 Encounter for screening for other disorder: Secondary | ICD-10-CM | POA: Diagnosis not present

## 2016-02-05 DIAGNOSIS — M1990 Unspecified osteoarthritis, unspecified site: Secondary | ICD-10-CM | POA: Diagnosis not present

## 2016-02-05 DIAGNOSIS — I10 Essential (primary) hypertension: Secondary | ICD-10-CM | POA: Diagnosis not present

## 2016-02-05 DIAGNOSIS — K589 Irritable bowel syndrome without diarrhea: Secondary | ICD-10-CM | POA: Diagnosis not present

## 2016-02-05 DIAGNOSIS — K219 Gastro-esophageal reflux disease without esophagitis: Secondary | ICD-10-CM | POA: Diagnosis not present

## 2016-02-05 DIAGNOSIS — G4709 Other insomnia: Secondary | ICD-10-CM | POA: Diagnosis not present

## 2016-02-05 DIAGNOSIS — J3089 Other allergic rhinitis: Secondary | ICD-10-CM | POA: Diagnosis not present

## 2016-02-05 DIAGNOSIS — M4800 Spinal stenosis, site unspecified: Secondary | ICD-10-CM | POA: Diagnosis not present

## 2016-02-05 DIAGNOSIS — Z0000 Encounter for general adult medical examination without abnormal findings: Secondary | ICD-10-CM | POA: Diagnosis not present

## 2016-02-05 DIAGNOSIS — Z6824 Body mass index (BMI) 24.0-24.9, adult: Secondary | ICD-10-CM | POA: Diagnosis not present

## 2016-02-05 DIAGNOSIS — E784 Other hyperlipidemia: Secondary | ICD-10-CM | POA: Diagnosis not present

## 2016-04-01 ENCOUNTER — Encounter: Payer: Self-pay | Admitting: Nurse Practitioner

## 2016-04-01 ENCOUNTER — Ambulatory Visit (INDEPENDENT_AMBULATORY_CARE_PROVIDER_SITE_OTHER): Payer: Medicare Other | Admitting: Nurse Practitioner

## 2016-04-01 ENCOUNTER — Telehealth: Payer: Self-pay | Admitting: Nurse Practitioner

## 2016-04-01 VITALS — BP 128/80 | HR 72 | Resp 12 | Ht 67.25 in | Wt 156.6 lb

## 2016-04-01 DIAGNOSIS — Z124 Encounter for screening for malignant neoplasm of cervix: Secondary | ICD-10-CM

## 2016-04-01 DIAGNOSIS — E559 Vitamin D deficiency, unspecified: Secondary | ICD-10-CM | POA: Diagnosis not present

## 2016-04-01 DIAGNOSIS — I83009 Varicose veins of unspecified lower extremity with ulcer of unspecified site: Secondary | ICD-10-CM

## 2016-04-01 DIAGNOSIS — Z01411 Encounter for gynecological examination (general) (routine) with abnormal findings: Secondary | ICD-10-CM

## 2016-04-01 DIAGNOSIS — G2581 Restless legs syndrome: Secondary | ICD-10-CM | POA: Diagnosis not present

## 2016-04-01 DIAGNOSIS — Z0000 Encounter for general adult medical examination without abnormal findings: Secondary | ICD-10-CM

## 2016-04-01 DIAGNOSIS — L97909 Non-pressure chronic ulcer of unspecified part of unspecified lower leg with unspecified severity: Secondary | ICD-10-CM

## 2016-04-01 MED ORDER — ESTRADIOL 0.1 MG/GM VA CREA: TOPICAL_CREAM | VAGINAL | 3 refills | Status: DC

## 2016-04-01 NOTE — Telephone Encounter (Signed)
Patient called and said, "Please tell Colletta [place] the sample name is Proctofoam HC. I was seen earlier and told to call with the information."

## 2016-04-01 NOTE — Progress Notes (Signed)
Patient ID: Shelia [name], female   DOB: May 05, 1945, 71 y.o.   MRN: [id number]  71 y.o. [id number] Married  Caucasian Fe here for annual exam.  Using olive oil prn for lubrication.  She does have RX for Premarin vaginal cream but is so expensive.  She is OK to try Estrace vaginal cream.  Also having pain and burning discomfort with vein on right leg.  She thinks also RLS.  She has now decided to retire at her birthday but is trying to figure out about Medicare coverage.  She brings her manual from [employer] to go over and see what is covered for vaginal estrogen.  Patient's last menstrual period was 08/02/1979.          Sexually active: Yes.    The current method of family planning is status post hysterectomy.    Exercising: Yes.    Brisk Walking Smoker:  no  Health Maintenance: Pap:  2013, Negative MMG: 08/29/15, 3D, Bi-Rads 1: Negative Colonoscopy:  07/2006 normal - repeat in 5 years BMD: long time ago per patient TDaP:  08/17/12 Shingles: Never Pneumonia:12/5/1 Hep C and HIV: Never Labs: Vit D done here   reports that she has never smoked. She has never used smokeless tobacco. She reports that she does not drink alcohol or use drugs.  Past Medical History:  Diagnosis Date  . Cystitis   . Depression   . DJD (degenerative joint disease)   . GERD (gastroesophageal reflux disease)   . Hyperlipidemia   . Hypertension   . Irritable bowel syndrome (IBS)   . Spinal stenosis     Past Surgical History:  Procedure Laterality Date  . ABDOMINAL HYSTERECTOMY  1981   TVH  . [place]   l-5,s-1  . BACK SURGERY  2000   l-5,s-1  . BACK SURGERY  2002   l-5,s-1  . BACK SURGERY  2003   l-5,s-2  . [place]   c-4,c-5  . [place]   c-4,c-5  . CHOLECYSTECTOMY     lap  . COLONOSCOPY  2008   polyps  . hemorrhoid banding  2011    Current Outpatient Prescriptions  Medication Sig Dispense Refill  . aspirin 81 MG tablet Take 81 mg by mouth  daily.    . cholecalciferol (VITAMIN D) 1000 UNITS tablet Take 1,000 Units by mouth daily.    . fish oil-omega-3 fatty acids 1000 MG capsule Take 2 g by mouth daily.      Marland Kitchen losartan-hydrochlorothiazide (HYZAAR) 100-25 MG per tablet Take 1 tablet by mouth daily.      . meloxicam (MOBIC) 7.5 MG tablet     . Multiple Vitamins-Minerals (MULTIVITAMIN PO) Take by mouth daily.    . potassium chloride SA (K-DUR,KLOR-CON) 20 MEQ tablet Take 20 mEq by mouth 2 (two) times daily.      . Throat Lozenges (ZINC + VITAMIN C MT) Use as directed in the mouth or throat daily.    Marland Kitchen triamterene-hydrochlorothiazide (DYAZIDE) 37.5-25 MG per capsule Take 1 capsule by mouth every morning.    . zolpidem (AMBIEN) 10 MG tablet Take 10 mg by mouth at bedtime as needed.      Marland Kitchen estradiol (ESTRACE) 0.1 MG/GM vaginal cream Use 1/2 g vaginally twice a week 42.5 g 3   No current facility-administered medications for this visit.     Family History  Problem Relation Age of Onset  . Heart disease Mother   . Heart disease  Father   . Hemochromatosis Daughter     ROS:  Pertinent items are noted in HPI.  Otherwise, a comprehensive ROS was negative.  Exam:   BP 128/80 (BP Location: Right Arm, Patient Position: Sitting, Cuff Size: Normal)   Pulse 72   Resp 12   Ht 5' 7.25" (1.708 m)   Wt 156 lb 9.6 oz (71 kg)   LMP 08/02/1979   BMI 24.35 kg/m  Height: 5' 7.25" (170.8 cm) Ht Readings from Last 3 Encounters:  04/01/16 5' 7.25" (1.708 m)  09/01/13 5\' 7"  (1.702 m)  07/05/13 5\' 8"  (1.727 m)    General appearance: alert, cooperative and appears stated age Head: Normocephalic, without obvious abnormality, atraumatic Neck: no adenopathy, supple, symmetrical, trachea midline and thyroid normal to inspection and palpation Lungs: clear to auscultation bilaterally Breasts: normal appearance, no masses or tenderness Heart: regular rate and rhythm Abdomen: soft, non-tender; no masses,  no organomegaly Extremities: extremities  normal, atraumatic, no cyanosis or edema.  There are prominent varicose veins right leg where she has pain. Skin: Skin color, texture, turgor normal. No rashes or lesions Lymph nodes: Cervical, supraclavicular, and axillary nodes normal. No abnormal inguinal nodes palpated Neurologic: Grossly normal   Pelvic: External genitalia:  no lesions              Urethra:  normal appearing urethra with no masses, tenderness or lesions              Bartholin's and Skene's: normal                 Vagina: normal appearing vagina with normal color and discharge, no lesions              Cervix: absent              Pap taken: No. Bimanual Exam:  Uterus:  uterus absent              Adnexa: no mass, fullness, tenderness               Rectovaginal: Confirms               Anus:  normal sphincter tone, no lesions  Chaperone present: yes  A:  Well Woman with normal exam  Postmenopausal will now stop ERT             S/P TVH with A&P repair 1981 secondary to AUB  Atrophic vaginitis  Varicose veins and ? RLS                        P:   Reviewed health and wellness pertinent to exam  Pap smear not done  Mammogram is due 07/2016  Will stop Premarin po  Will change Premarin vaginal cream to Estrace vaginal cream due to coverage.  Counseled on breast self exam, mammography screening, adequate intake of calcium and vitamin D, diet and exercise, Kegel's exercises return annually or prn  An After Visit Summary was printed and given to the patient.

## 2016-04-01 NOTE — Patient Instructions (Signed)

## 2016-04-02 MED ORDER — CLOBETASOL PROPIONATE 0.05 % EX OINT: 1.0000 "application " | TOPICAL_OINTMENT | Freq: Two times a day (BID) | CUTANEOUS | 0 refills | Status: DC

## 2016-04-02 NOTE — Progress Notes (Signed)
Encounter reviewed by Dr. Ouida Abeyta Amundson C. Silva.  

## 2016-04-02 NOTE — Telephone Encounter (Signed)
Pt was notified that Temovate was the cream she used in the past and not the Proctofoam for hemorrhoids on the vulvar dermatitis.  The new RX is sent to her pharmacy.  She is most appreciative.

## 2016-04-03 ENCOUNTER — Telehealth: Payer: Self-pay | Admitting: *Deleted

## 2016-04-03 NOTE — Telephone Encounter (Signed)
Patient insurance is calling regarding prior authorization for Estradiol 0.01% cream.  States generic estradiol is not covered by patient's insurance plan.  Brand Estrace is "covered on formulary list and no review is needed."    Please order new RX for brand only estrace.  Per Dewaine Oats, this should go through without any problems at pharmacy.

## 2016-04-04 ENCOUNTER — Other Ambulatory Visit: Payer: Self-pay | Admitting: Nurse Practitioner

## 2016-04-04 MED ORDER — ESTRADIOL 0.1 MG/GM VA CREA: TOPICAL_CREAM | VAGINAL | 3 refills | Status: DC

## 2016-04-04 NOTE — Telephone Encounter (Signed)
Order was put in for brand name only.  Ok to close encounter.

## 2016-04-08 DIAGNOSIS — M79604 Pain in right leg: Secondary | ICD-10-CM | POA: Diagnosis not present

## 2016-04-09 NOTE — Addendum Note (Signed)
Addended by: Abelino Derrick C on: 04/09/2016 10:20 AM   Modules accepted: Orders

## 2016-04-10 ENCOUNTER — Other Ambulatory Visit (INDEPENDENT_AMBULATORY_CARE_PROVIDER_SITE_OTHER): Payer: Medicare Other

## 2016-04-10 DIAGNOSIS — M79604 Pain in right leg: Secondary | ICD-10-CM | POA: Diagnosis not present

## 2016-04-10 DIAGNOSIS — E559 Vitamin D deficiency, unspecified: Secondary | ICD-10-CM

## 2016-04-11 LAB — VITAMIN D 25 HYDROXY (VIT D DEFICIENCY, FRACTURES): Vit D, 25-Hydroxy: 46 ng/mL (ref 30–100)

## 2016-04-16 DIAGNOSIS — M79604 Pain in right leg: Secondary | ICD-10-CM | POA: Diagnosis not present

## 2016-05-23 DIAGNOSIS — M5431 Sciatica, right side: Secondary | ICD-10-CM | POA: Diagnosis not present

## 2016-05-23 DIAGNOSIS — M4316 Spondylolisthesis, lumbar region: Secondary | ICD-10-CM | POA: Diagnosis not present

## 2016-05-23 DIAGNOSIS — M48061 Spinal stenosis, lumbar region without neurogenic claudication: Secondary | ICD-10-CM | POA: Diagnosis not present

## 2016-05-23 DIAGNOSIS — M5137 Other intervertebral disc degeneration, lumbosacral region: Secondary | ICD-10-CM | POA: Diagnosis not present

## 2016-05-29 DIAGNOSIS — M5431 Sciatica, right side: Secondary | ICD-10-CM | POA: Diagnosis not present

## 2016-06-04 DIAGNOSIS — M5431 Sciatica, right side: Secondary | ICD-10-CM | POA: Diagnosis not present

## 2016-06-06 DIAGNOSIS — M5431 Sciatica, right side: Secondary | ICD-10-CM | POA: Diagnosis not present

## 2016-06-10 DIAGNOSIS — M5431 Sciatica, right side: Secondary | ICD-10-CM | POA: Diagnosis not present

## 2016-06-12 DIAGNOSIS — M5431 Sciatica, right side: Secondary | ICD-10-CM | POA: Diagnosis not present

## 2016-06-20 DIAGNOSIS — M5431 Sciatica, right side: Secondary | ICD-10-CM | POA: Diagnosis not present

## 2016-06-23 DIAGNOSIS — M4316 Spondylolisthesis, lumbar region: Secondary | ICD-10-CM | POA: Diagnosis not present

## 2016-06-23 DIAGNOSIS — M5431 Sciatica, right side: Secondary | ICD-10-CM | POA: Diagnosis not present

## 2016-06-23 DIAGNOSIS — M48061 Spinal stenosis, lumbar region without neurogenic claudication: Secondary | ICD-10-CM | POA: Diagnosis not present

## 2016-06-25 DIAGNOSIS — M5431 Sciatica, right side: Secondary | ICD-10-CM | POA: Diagnosis not present

## 2016-07-04 DIAGNOSIS — M545 Low back pain: Secondary | ICD-10-CM | POA: Diagnosis not present

## 2016-07-04 DIAGNOSIS — M961 Postlaminectomy syndrome, not elsewhere classified: Secondary | ICD-10-CM | POA: Diagnosis not present

## 2016-07-04 DIAGNOSIS — M5136 Other intervertebral disc degeneration, lumbar region: Secondary | ICD-10-CM | POA: Diagnosis not present

## 2016-07-04 DIAGNOSIS — M5137 Other intervertebral disc degeneration, lumbosacral region: Secondary | ICD-10-CM | POA: Diagnosis not present

## 2016-07-21 DIAGNOSIS — M48061 Spinal stenosis, lumbar region without neurogenic claudication: Secondary | ICD-10-CM | POA: Diagnosis not present

## 2016-07-21 DIAGNOSIS — M5431 Sciatica, right side: Secondary | ICD-10-CM | POA: Diagnosis not present

## 2016-08-11 DIAGNOSIS — Z780 Asymptomatic menopausal state: Secondary | ICD-10-CM | POA: Diagnosis not present

## 2016-08-11 DIAGNOSIS — M8589 Other specified disorders of bone density and structure, multiple sites: Secondary | ICD-10-CM | POA: Diagnosis not present

## 2016-08-11 DIAGNOSIS — Z1231 Encounter for screening mammogram for malignant neoplasm of breast: Secondary | ICD-10-CM | POA: Diagnosis not present

## 2016-08-12 ENCOUNTER — Other Ambulatory Visit: Payer: Self-pay | Admitting: Orthopedic Surgery

## 2016-08-12 ENCOUNTER — Ambulatory Visit
Admission: RE | Admit: 2016-08-12 | Discharge: 2016-08-12 | Disposition: A | Discharge status: Home / Self Care | Payer: Medicare Other | Source: Ambulatory Visit | Attending: Orthopedic Surgery | Admitting: Orthopedic Surgery

## 2016-08-12 DIAGNOSIS — M48061 Spinal stenosis, lumbar region without neurogenic claudication: Secondary | ICD-10-CM

## 2016-08-12 DIAGNOSIS — M545 Low back pain: Secondary | ICD-10-CM | POA: Diagnosis not present

## 2016-08-15 DIAGNOSIS — M48061 Spinal stenosis, lumbar region without neurogenic claudication: Secondary | ICD-10-CM | POA: Diagnosis not present

## 2016-08-15 DIAGNOSIS — M4316 Spondylolisthesis, lumbar region: Secondary | ICD-10-CM | POA: Diagnosis not present

## 2016-08-19 ENCOUNTER — Telehealth: Payer: Self-pay | Admitting: Nurse Practitioner

## 2016-08-19 ENCOUNTER — Ambulatory Visit: Payer: Self-pay | Admitting: Physician Assistant

## 2016-08-19 NOTE — Telephone Encounter (Addendum)
Please let pt know that BMD done on 08/11/16 shows a T Score at the spine is -1.50; right hip neck at -2.00; left hip neck at  -1.80.  The lowest measured site is the right hip.  All measurements are in the Osteopenic range.  The FRAX score for a major fracture in 10 yrs is 18% (goal is <20%); the FRAX score for a hip fracture in 10 yrs is 3.4% (goal is < 3%).  We have no comparison studies in our sytem of the previous bone density to look at. In addition she has had several back surgeries. Some bone loss is expectant in postmenopausal women - but we do not want to see further loss.  It would be great for you to add upper body weights as tolerated to your routine of walking.  You must continue calcium and Vit D support.  Repeat BMD in 2 yrs to check for stability.

## 2016-08-19 NOTE — Telephone Encounter (Signed)
Patient notified of bone density results. Agreeable with adding weights to exercise.  She is having back surgery on 09/11/16 and will wait until after this surgery to start any new exercise.  Patient is very appreciative of call and would like Patty to know she is very thankful of Patty making sure she went back to PCP to follow up on tingling she was having in her legs.  Patient states she was unable to pay for Premarin (cream or pill) so she has not been taking.  She is now taking Estroven OTC and says this has "really helped" her hot flashes. Medication list updated with new pain medications from orthopedic doctor and [place].  Routing to provider for review. Closing encounter.

## 2016-08-20 DIAGNOSIS — Z1389 Encounter for screening for other disorder: Secondary | ICD-10-CM | POA: Diagnosis not present

## 2016-08-20 DIAGNOSIS — M48061 Spinal stenosis, lumbar region without neurogenic claudication: Secondary | ICD-10-CM | POA: Diagnosis not present

## 2016-08-20 DIAGNOSIS — Z6826 Body mass index (BMI) 26.0-26.9, adult: Secondary | ICD-10-CM | POA: Diagnosis not present

## 2016-08-20 DIAGNOSIS — E784 Other hyperlipidemia: Secondary | ICD-10-CM | POA: Diagnosis not present

## 2016-08-20 DIAGNOSIS — Z01818 Encounter for other preprocedural examination: Secondary | ICD-10-CM | POA: Diagnosis not present

## 2016-08-20 DIAGNOSIS — M859 Disorder of bone density and structure, unspecified: Secondary | ICD-10-CM | POA: Diagnosis not present

## 2016-08-20 DIAGNOSIS — I10 Essential (primary) hypertension: Secondary | ICD-10-CM | POA: Diagnosis not present

## 2016-09-02 DIAGNOSIS — M48061 Spinal stenosis, lumbar region without neurogenic claudication: Secondary | ICD-10-CM | POA: Diagnosis not present

## 2016-09-04 ENCOUNTER — Encounter (HOSPITAL_COMMUNITY): Payer: Self-pay

## 2016-09-04 NOTE — Pre-Procedure Instructions (Signed)
RYHANNA DUNSMORE  09/04/2016      [id number] - [place], [place] [address] [place] [address] Phone: [phone] Fax: [phone]  [id number] - [place], [place] [address]  [place] [address] Phone: [phone] Fax: [phone]  [place], [place] - [address] [place] [address] Phone: [phone] Fax: [phone]    Your procedure is scheduled on Thursday 09/11/2016.  Report to [facility] Admitting at [place].M.  Call this number if you have problems the morning of surgery:  [phone]   Remember:  Do not eat food or drink liquids after midnight.  Take these medicines the morning of surgery with A SIP OF WATER: fluticasone (Flonase) - if needed   7 days prior to surgery STOP taking any Aspirin, Aleve, Naproxen, Ibuprofen, Motrin, Advil, Goody's, BC's, all herbal medications, fish oil, and all vitamins; including Estroven Menopause Relief and celecoxib (Celebrex)     Do not wear jewelry, make-up or nail polish.  Do not wear lotions, powders, or perfumes, or deodorant.  Do not shave 48 hours prior to surgery.    Do not bring valuables to the hospital.  [facility] is not responsible for any belongings or valuables.  Contacts, dentures or bridgework may not be worn into surgery.  Leave your suitcase in the car.  After surgery it may be brought to your room.  For patients admitted to the hospital, discharge time will be determined by your treatment team.  Patients discharged the day of surgery will not be allowed to drive home.   Name and phone number of your driver:    Special instructions:   [facility]- Preparing For Surgery  Before surgery, you can play an important role. Because skin is not sterile, your skin needs to be as free of  germs as possible. You can reduce the number of germs on your skin by washing with CHG (chlorahexidine gluconate) Soap before surgery.  CHG is an antiseptic cleaner which kills germs and bonds with the skin to continue killing germs even after washing.  Please do not use if you have an allergy to CHG or antibacterial soaps. If your skin becomes reddened/irritated stop using the CHG.  Do not shave (including legs and underarms) for at least 48 hours prior to first CHG shower. It is OK to shave your face.  Please follow these instructions carefully.   1. Shower the NIGHT BEFORE SURGERY and the MORNING OF SURGERY with CHG.   2. If you chose to wash your hair, wash your hair first as usual with your normal shampoo.  3. After you shampoo, rinse your hair and body thoroughly to remove the shampoo.  4. Use CHG as you would any other liquid soap. You can apply CHG directly to the skin and wash gently with a scrungie or a clean washcloth.   5. Apply the CHG Soap to your body ONLY FROM THE NECK DOWN.  Do not use on open wounds or open sores. Avoid contact with your eyes, ears, mouth and genitals (private parts). Wash genitals (private parts) with your normal soap.  6. Wash thoroughly, paying special attention to the area where your surgery will be performed.  7. Thoroughly rinse your body with warm water from the neck down.  8. DO NOT shower/wash with your normal soap  after using and rinsing off the CHG Soap.  9. Pat yourself dry with a CLEAN TOWEL.   10. Wear CLEAN PAJAMAS   11. Place CLEAN SHEETS on your bed the night of your first shower and DO NOT SLEEP WITH PETS.    Day of Surgery: Do not apply any deodorants/lotions. Please wear clean clothes to the hospital/surgery center.      Please read over the following fact sheets that you were given. Pain Booklet, Coughing and Deep Breathing and Surgical Site Infection Prevention

## 2016-09-05 ENCOUNTER — Encounter (HOSPITAL_COMMUNITY)
Admission: RE | Admit: 2016-09-05 | Discharge: 2016-09-05 | Disposition: A | Discharge status: Home / Self Care | Payer: Medicare Other | Source: Ambulatory Visit | Attending: Orthopedic Surgery | Admitting: Orthopedic Surgery

## 2016-09-05 ENCOUNTER — Encounter (HOSPITAL_COMMUNITY): Payer: Self-pay

## 2016-09-05 DIAGNOSIS — Z01818 Encounter for other preprocedural examination: Secondary | ICD-10-CM | POA: Diagnosis not present

## 2016-09-05 DIAGNOSIS — M48061 Spinal stenosis, lumbar region without neurogenic claudication: Secondary | ICD-10-CM | POA: Diagnosis not present

## 2016-09-05 HISTORY — DX: Other specified postprocedural states: R11.2

## 2016-09-05 HISTORY — DX: Other specified postprocedural states: Z98.890

## 2016-09-05 LAB — SURGICAL PCR SCREEN
MRSA, PCR: NEGATIVE
Staphylococcus aureus: NEGATIVE

## 2016-09-05 LAB — BASIC METABOLIC PANEL
Anion gap: 10 (ref 5–15)
BUN: 16 mg/dL (ref 6–20)
CO2: 29 mmol/L (ref 22–32)
CREATININE: 1.12 mg/dL — AB (ref 0.44–1.00)
Calcium: 10 mg/dL (ref 8.9–10.3)
Chloride: 100 mmol/L — ABNORMAL LOW (ref 101–111)
GFR, EST AFRICAN AMERICAN: 56 mL/min — AB (ref >60)
GFR, EST NON AFRICAN AMERICAN: 49 mL/min — AB (ref >60)
Glucose, Bld: 97 mg/dL (ref 65–99)
Potassium: 3.3 mmol/L — ABNORMAL LOW (ref 3.5–5.1)
SODIUM: 139 mmol/L (ref 135–145)

## 2016-09-05 LAB — CBC
HCT: 41.2 % (ref 36.0–46.0)
Hemoglobin: 13.7 g/dL (ref 12.0–15.0)
MCH: 30.3 pg (ref 26.0–34.0)
MCHC: 33.3 g/dL (ref 30.0–36.0)
MCV: 91.2 fL (ref 78.0–100.0)
PLATELETS: 289 10*3/uL (ref 150–400)
RBC: 4.52 MIL/uL (ref 3.87–5.11)
RDW: 12.3 % (ref 11.5–15.5)
WBC: 6.9 10*3/uL (ref 4.0–10.5)

## 2016-09-05 LAB — TYPE AND SCREEN
ABO/RH(D): A POS
ANTIBODY SCREEN: NEGATIVE

## 2016-09-05 NOTE — Progress Notes (Signed)
PCP - Ravisankar Avva Cardiologist - denies  Chest x-ray - denies EKG - requesting from [place] - denies ECHO - denies Cardiac Cath - denies      Patient denies shortness of breath, fever, cough and chest pain at PAT appointment   Patient verbalized understanding of instructions that were given to them at the PAT appointment. Patient was also instructed that they will need to review over the PAT instructions again at home before surgery.

## 2016-09-06 LAB — ABO/RH: ABO/RH(D): A POS

## 2016-09-11 ENCOUNTER — Inpatient Hospital Stay (HOSPITAL_COMMUNITY): Payer: Medicare Other | Admitting: Certified Registered"

## 2016-09-11 ENCOUNTER — Encounter (HOSPITAL_COMMUNITY)
Admission: RE | Disposition: A | Discharge status: Home / Self Care | Payer: Self-pay | Source: Ambulatory Visit | Attending: Orthopedic Surgery

## 2016-09-11 ENCOUNTER — Inpatient Hospital Stay (HOSPITAL_COMMUNITY): Payer: Medicare Other

## 2016-09-11 ENCOUNTER — Encounter (HOSPITAL_COMMUNITY): Payer: Self-pay | Admitting: *Deleted

## 2016-09-11 ENCOUNTER — Inpatient Hospital Stay (HOSPITAL_COMMUNITY)
Admission: RE | Admit: 2016-09-11 | Discharge: 2016-09-13 | DRG: 460 | Disposition: A | Discharge status: Home / Self Care | Payer: Medicare Other | Source: Ambulatory Visit | Attending: Orthopedic Surgery | Admitting: Orthopedic Surgery

## 2016-09-11 DIAGNOSIS — N3020 Other chronic cystitis without hematuria: Secondary | ICD-10-CM | POA: Diagnosis present

## 2016-09-11 DIAGNOSIS — Z7982 Long term (current) use of aspirin: Secondary | ICD-10-CM

## 2016-09-11 DIAGNOSIS — Z472 Encounter for removal of internal fixation device: Secondary | ICD-10-CM | POA: Diagnosis not present

## 2016-09-11 DIAGNOSIS — Z9889 Other specified postprocedural states: Secondary | ICD-10-CM

## 2016-09-11 DIAGNOSIS — M48061 Spinal stenosis, lumbar region without neurogenic claudication: Secondary | ICD-10-CM | POA: Diagnosis present

## 2016-09-11 DIAGNOSIS — M5137 Other intervertebral disc degeneration, lumbosacral region: Secondary | ICD-10-CM | POA: Diagnosis present

## 2016-09-11 DIAGNOSIS — Z8249 Family history of ischemic heart disease and other diseases of the circulatory system: Secondary | ICD-10-CM | POA: Diagnosis not present

## 2016-09-11 DIAGNOSIS — M48062 Spinal stenosis, lumbar region with neurogenic claudication: Secondary | ICD-10-CM | POA: Diagnosis not present

## 2016-09-11 DIAGNOSIS — K219 Gastro-esophageal reflux disease without esophagitis: Secondary | ICD-10-CM | POA: Diagnosis present

## 2016-09-11 DIAGNOSIS — K589 Irritable bowel syndrome without diarrhea: Secondary | ICD-10-CM | POA: Diagnosis present

## 2016-09-11 DIAGNOSIS — M4807 Spinal stenosis, lumbosacral region: Secondary | ICD-10-CM | POA: Diagnosis not present

## 2016-09-11 DIAGNOSIS — M545 Low back pain, unspecified: Secondary | ICD-10-CM | POA: Diagnosis present

## 2016-09-11 DIAGNOSIS — T84498A Other mechanical complication of other internal orthopedic devices, implants and grafts, initial encounter: Secondary | ICD-10-CM | POA: Diagnosis not present

## 2016-09-11 DIAGNOSIS — Z90710 Acquired absence of both cervix and uterus: Secondary | ICD-10-CM | POA: Diagnosis not present

## 2016-09-11 DIAGNOSIS — G8929 Other chronic pain: Secondary | ICD-10-CM | POA: Diagnosis present

## 2016-09-11 DIAGNOSIS — M549 Dorsalgia, unspecified: Secondary | ICD-10-CM | POA: Diagnosis not present

## 2016-09-11 DIAGNOSIS — I10 Essential (primary) hypertension: Secondary | ICD-10-CM | POA: Diagnosis present

## 2016-09-11 DIAGNOSIS — Z419 Encounter for procedure for purposes other than remedying health state, unspecified: Secondary | ICD-10-CM

## 2016-09-11 HISTORY — PX: LUMBAR LAMINECTOMY/DECOMPRESSION MICRODISCECTOMY: SHX5026

## 2016-09-11 SURGERY — LUMBAR LAMINECTOMY/DECOMPRESSION MICRODISCECTOMY 2 LEVELS
Anesthesia: General | Site: Spine Lumbar

## 2016-09-11 MED ORDER — DEXAMETHASONE SODIUM PHOSPHATE 10 MG/ML IJ SOLN
INTRAMUSCULAR | Status: DC | PRN
  Administered 2016-09-11: 10 mg via INTRAVENOUS

## 2016-09-11 MED ORDER — FENTANYL CITRATE (PF) 100 MCG/2ML IJ SOLN: 25.0000 ug | INTRAMUSCULAR | Status: DC | PRN

## 2016-09-11 MED ORDER — HEMOSTATIC AGENTS (NO CHARGE) OPTIME
TOPICAL | Status: DC | PRN
  Administered 2016-09-11: 1 via TOPICAL

## 2016-09-11 MED ORDER — OXYCODONE HCL 5 MG/5ML PO SOLN: 5.0000 mg | Freq: Once | ORAL | Status: DC | PRN

## 2016-09-11 MED ORDER — PROPOFOL 10 MG/ML IV BOLUS
INTRAVENOUS | Status: DC | PRN
  Administered 2016-09-11: 130 mg via INTRAVENOUS
  Administered 2016-09-11: 30 mg via INTRAVENOUS

## 2016-09-11 MED ORDER — PHENOL 1.4 % MT LIQD: 1.0000 | OROMUCOSAL | Status: DC | PRN

## 2016-09-11 MED ORDER — MIDAZOLAM HCL 2 MG/2ML IJ SOLN
INTRAMUSCULAR | Status: AC
  Filled 2016-09-11: qty 2

## 2016-09-11 MED ORDER — LACTATED RINGERS IV SOLN: INTRAVENOUS | Status: DC

## 2016-09-11 MED ORDER — HYDROCHLOROTHIAZIDE 25 MG PO TABS
25.0000 mg | ORAL_TABLET | Freq: Every day | ORAL | Status: DC
  Filled 2016-09-11: qty 1

## 2016-09-11 MED ORDER — BUPIVACAINE-EPINEPHRINE (PF) 0.25% -1:200000 IJ SOLN
INTRAMUSCULAR | Status: AC
  Filled 2016-09-11: qty 30

## 2016-09-11 MED ORDER — PROPOFOL 10 MG/ML IV BOLUS
INTRAVENOUS | Status: AC
  Filled 2016-09-11: qty 20

## 2016-09-11 MED ORDER — FENTANYL CITRATE (PF) 100 MCG/2ML IJ SOLN
INTRAMUSCULAR | Status: DC | PRN
  Administered 2016-09-11: 75 ug via INTRAVENOUS
  Administered 2016-09-11: 50 ug via INTRAVENOUS
  Administered 2016-09-11: 25 ug via INTRAVENOUS
  Administered 2016-09-11 (×2): 50 ug via INTRAVENOUS

## 2016-09-11 MED ORDER — OXYCODONE HCL 5 MG PO TABS
5.0000 mg | ORAL_TABLET | Freq: Once | ORAL | Status: DC | PRN
Start: 2016-09-11 — End: 2016-09-11

## 2016-09-11 MED ORDER — MORPHINE SULFATE (PF) 2 MG/ML IV SOLN: 2.0000 mg | INTRAVENOUS | Status: DC | PRN

## 2016-09-11 MED ORDER — THROMBIN 20000 UNITS EX SOLR
CUTANEOUS | Status: DC | PRN
  Administered 2016-09-11: 20 mL via TOPICAL

## 2016-09-11 MED ORDER — ONDANSETRON HCL 4 MG/2ML IJ SOLN
4.0000 mg | Freq: Four times a day (QID) | INTRAMUSCULAR | Status: DC | PRN
  Administered 2016-09-11: 4 mg via INTRAVENOUS
  Filled 2016-09-11: qty 2

## 2016-09-11 MED ORDER — HYDROXYZINE HCL 50 MG/ML IM SOLN
50.0000 mg | INTRAMUSCULAR | Status: DC | PRN
  Administered 2016-09-11: 50 mg via INTRAMUSCULAR
  Filled 2016-09-11: qty 1

## 2016-09-11 MED ORDER — ONDANSETRON HCL 4 MG PO TABS: 4.0000 mg | ORAL_TABLET | Freq: Four times a day (QID) | ORAL | Status: DC | PRN

## 2016-09-11 MED ORDER — SUGAMMADEX SODIUM 200 MG/2ML IV SOLN
INTRAVENOUS | Status: DC | PRN
  Administered 2016-09-11: 150 mg via INTRAVENOUS

## 2016-09-11 MED ORDER — ACETAMINOPHEN 10 MG/ML IV SOLN
1000.0000 mg | INTRAVENOUS | Status: AC
  Administered 2016-09-11: 1000 mg via INTRAVENOUS
  Filled 2016-09-11: qty 100

## 2016-09-11 MED ORDER — TRIAMTERENE-HCTZ 37.5-25 MG PO TABS
0.5000 | ORAL_TABLET | Freq: Two times a day (BID) | ORAL | Status: DC
  Administered 2016-09-12: 0.5 via ORAL
  Filled 2016-09-11 (×4): qty 0.5

## 2016-09-11 MED ORDER — PHENYLEPHRINE HCL 10 MG/ML IJ SOLN
INTRAMUSCULAR | Status: DC | PRN
  Administered 2016-09-11: 20 ug/min via INTRAVENOUS
  Administered 2016-09-11: 11:00:00 via INTRAVENOUS

## 2016-09-11 MED ORDER — CEFAZOLIN SODIUM-DEXTROSE 2-4 GM/100ML-% IV SOLN
2.0000 g | Freq: Three times a day (TID) | INTRAVENOUS | Status: AC
  Administered 2016-09-11 (×2): 2 g via INTRAVENOUS
  Filled 2016-09-11 (×2): qty 100

## 2016-09-11 MED ORDER — THROMBIN 20000 UNITS EX SOLR
CUTANEOUS | Status: AC
  Filled 2016-09-11: qty 20000

## 2016-09-11 MED ORDER — LOSARTAN POTASSIUM 50 MG PO TABS: 100.0000 mg | ORAL_TABLET | Freq: Every day | ORAL | Status: DC

## 2016-09-11 MED ORDER — ACETAMINOPHEN 325 MG PO TABS
650.0000 mg | ORAL_TABLET | ORAL | Status: DC | PRN
  Administered 2016-09-12 – 2016-09-13 (×2): 650 mg via ORAL
  Filled 2016-09-11 (×2): qty 2

## 2016-09-11 MED ORDER — OXYCODONE HCL 5 MG PO TABS
10.0000 mg | ORAL_TABLET | ORAL | Status: DC | PRN
  Administered 2016-09-11 – 2016-09-13 (×11): 10 mg via ORAL
  Filled 2016-09-11 (×10): qty 2

## 2016-09-11 MED ORDER — METHOCARBAMOL 500 MG PO TABS
ORAL_TABLET | ORAL | Status: AC
  Filled 2016-09-11: qty 1

## 2016-09-11 MED ORDER — ALBUMIN HUMAN 5 % IV SOLN
INTRAVENOUS | Status: DC | PRN
Start: 2016-09-11 — End: 2016-09-11
  Administered 2016-09-11: 11:00:00 via INTRAVENOUS

## 2016-09-11 MED ORDER — MENTHOL 3 MG MT LOZG: 1.0000 | LOZENGE | OROMUCOSAL | Status: DC | PRN

## 2016-09-11 MED ORDER — MIDAZOLAM HCL 2 MG/2ML IJ SOLN
INTRAMUSCULAR | Status: DC | PRN
  Administered 2016-09-11: 1 mg via INTRAVENOUS

## 2016-09-11 MED ORDER — METHOCARBAMOL 500 MG PO TABS
500.0000 mg | ORAL_TABLET | Freq: Four times a day (QID) | ORAL | Status: DC | PRN
  Administered 2016-09-11 – 2016-09-13 (×6): 500 mg via ORAL
  Filled 2016-09-11 (×5): qty 1

## 2016-09-11 MED ORDER — LACTATED RINGERS IV SOLN
INTRAVENOUS | Status: DC | PRN
  Administered 2016-09-11 (×3): via INTRAVENOUS

## 2016-09-11 MED ORDER — POTASSIUM CHLORIDE CRYS ER 20 MEQ PO TBCR
20.0000 meq | EXTENDED_RELEASE_TABLET | Freq: Two times a day (BID) | ORAL | Status: DC
  Administered 2016-09-12 (×2): 20 meq via ORAL
  Filled 2016-09-11 (×2): qty 1

## 2016-09-11 MED ORDER — MORPHINE SULFATE (PF) 4 MG/ML IV SOLN
2.0000 mg | INTRAVENOUS | Status: AC | PRN
  Administered 2016-09-11: 2 mg via INTRAVENOUS
  Filled 2016-09-11: qty 1

## 2016-09-11 MED ORDER — ONDANSETRON HCL 4 MG/2ML IJ SOLN: 4.0000 mg | Freq: Once | INTRAMUSCULAR | Status: DC | PRN

## 2016-09-11 MED ORDER — CEFAZOLIN SODIUM-DEXTROSE 2-4 GM/100ML-% IV SOLN
2.0000 g | INTRAVENOUS | Status: AC
  Administered 2016-09-11: 2 g via INTRAVENOUS
  Filled 2016-09-11: qty 100

## 2016-09-11 MED ORDER — METHOCARBAMOL 1000 MG/10ML IJ SOLN: 500.0000 mg | Freq: Four times a day (QID) | INTRAVENOUS | Status: DC | PRN

## 2016-09-11 MED ORDER — CEFAZOLIN SODIUM 1 G IJ SOLR
INTRAMUSCULAR | Status: DC | PRN
  Administered 2016-09-11: 2 g via INTRAMUSCULAR

## 2016-09-11 MED ORDER — SODIUM CHLORIDE 0.9% FLUSH: 3.0000 mL | INTRAVENOUS | Status: DC | PRN

## 2016-09-11 MED ORDER — SODIUM CHLORIDE 0.9% FLUSH
3.0000 mL | Freq: Two times a day (BID) | INTRAVENOUS | Status: DC
  Administered 2016-09-11 – 2016-09-12 (×3): 3 mL via INTRAVENOUS

## 2016-09-11 MED ORDER — POLYETHYLENE GLYCOL 3350 17 G PO PACK: 17.0000 g | PACK | Freq: Every day | ORAL | Status: DC | PRN

## 2016-09-11 MED ORDER — LIDOCAINE HCL (CARDIAC) 20 MG/ML IV SOLN
INTRAVENOUS | Status: DC | PRN
  Administered 2016-09-11: 40 mg via INTRAVENOUS

## 2016-09-11 MED ORDER — LOSARTAN POTASSIUM-HCTZ 100-25 MG PO TABS: 1.0000 | ORAL_TABLET | Freq: Every day | ORAL | Status: DC

## 2016-09-11 MED ORDER — BUPIVACAINE-EPINEPHRINE (PF) 0.25% -1:200000 IJ SOLN
INTRAMUSCULAR | Status: DC | PRN
  Administered 2016-09-11: 10 mL

## 2016-09-11 MED ORDER — DEXAMETHASONE SODIUM PHOSPHATE 4 MG/ML IJ SOLN
4.0000 mg | Freq: Four times a day (QID) | INTRAMUSCULAR | Status: AC
  Administered 2016-09-11: 4 mg via INTRAVENOUS
  Filled 2016-09-11: qty 1

## 2016-09-11 MED ORDER — ACETAMINOPHEN 650 MG RE SUPP: 650.0000 mg | RECTAL | Status: DC | PRN

## 2016-09-11 MED ORDER — DEXAMETHASONE 4 MG PO TABS
4.0000 mg | ORAL_TABLET | Freq: Four times a day (QID) | ORAL | Status: AC
  Administered 2016-09-11 – 2016-09-12 (×2): 4 mg via ORAL
  Filled 2016-09-11 (×2): qty 1

## 2016-09-11 MED ORDER — ROCURONIUM BROMIDE 100 MG/10ML IV SOLN
INTRAVENOUS | Status: DC | PRN
  Administered 2016-09-11 (×3): 10 mg via INTRAVENOUS
  Administered 2016-09-11: 40 mg via INTRAVENOUS
  Administered 2016-09-11 (×3): 10 mg via INTRAVENOUS

## 2016-09-11 MED ORDER — OXYCODONE HCL 5 MG PO TABS
ORAL_TABLET | ORAL | Status: AC
  Filled 2016-09-11: qty 2

## 2016-09-11 MED ORDER — HYDROXYZINE HCL 25 MG PO TABS
25.0000 mg | ORAL_TABLET | ORAL | Status: DC | PRN
  Administered 2016-09-12: 25 mg via ORAL
  Filled 2016-09-11: qty 1

## 2016-09-11 MED ORDER — 0.9 % SODIUM CHLORIDE (POUR BTL) OPTIME
TOPICAL | Status: DC | PRN
  Administered 2016-09-11 (×2): 1000 mL

## 2016-09-11 MED ORDER — ONDANSETRON HCL 4 MG/2ML IJ SOLN
INTRAMUSCULAR | Status: DC | PRN
  Administered 2016-09-11: 4 mg via INTRAVENOUS

## 2016-09-11 MED ORDER — FENTANYL CITRATE (PF) 250 MCG/5ML IJ SOLN
INTRAMUSCULAR | Status: AC
  Filled 2016-09-11: qty 5

## 2016-09-11 MED ORDER — PHENYLEPHRINE HCL 10 MG/ML IJ SOLN
INTRAMUSCULAR | Status: DC | PRN
  Administered 2016-09-11 (×3): 80 ug via INTRAVENOUS

## 2016-09-11 SURGICAL SUPPLY — 74 items
BNDG GAUZE ELAST 4 BULKY (GAUZE/BANDAGES/DRESSINGS) IMPLANT
BONE VIVIGEN FORMABLE 5.4CC (Bone Implant) ×3 IMPLANT
BUR EGG ELITE 4.0 (BURR) ×2 IMPLANT
BUR EGG ELITE 4.0MM (BURR) ×1
CLOSURE STERI-STRIP 1/2X4 (GAUZE/BANDAGES/DRESSINGS) ×1
CLOSURE WOUND 1/2 X4 (GAUZE/BANDAGES/DRESSINGS)
CLSR STERI-STRIP ANTIMIC 1/2X4 (GAUZE/BANDAGES/DRESSINGS) ×2 IMPLANT
CONT SPEC 4OZ CLIKSEAL STRL BL (MISCELLANEOUS) ×3 IMPLANT
CORDS BIPOLAR (ELECTRODE) ×3 IMPLANT
COVER SURGICAL LIGHT HANDLE (MISCELLANEOUS) ×3 IMPLANT
DRAIN CHANNEL 15F RND FF W/TCR (WOUND CARE) ×3 IMPLANT
DRAPE C-ARM 42X72 X-RAY (DRAPES) IMPLANT
DRAPE LAPAROTOMY T 102X78X121 (DRAPES) ×3 IMPLANT
DRAPE POUCH INSTRU U-SHP 10X18 (DRAPES) ×3 IMPLANT
DRAPE SURG 17X11 SM STRL (DRAPES) ×3 IMPLANT
DRAPE U-SHAPE 47X51 STRL (DRAPES) ×3 IMPLANT
DRSG AQUACEL AG ADV 3.5X 6 (GAUZE/BANDAGES/DRESSINGS) IMPLANT
DRSG MEPILEX BORDER 4X4 (GAUZE/BANDAGES/DRESSINGS) ×3 IMPLANT
DRSG OPSITE POSTOP 4X8 (GAUZE/BANDAGES/DRESSINGS) ×3 IMPLANT
DURAPREP 26ML APPLICATOR (WOUND CARE) ×3 IMPLANT
ELECT BLADE 4.0 EZ CLEAN MEGAD (MISCELLANEOUS) ×3
ELECT CAUTERY BLADE 6.4 (BLADE) ×3 IMPLANT
ELECT PENCIL ROCKER SW 15FT (MISCELLANEOUS) ×3 IMPLANT
ELECT REM PT RETURN 9FT ADLT (ELECTROSURGICAL) ×3
ELECTRODE BLDE 4.0 EZ CLN MEGD (MISCELLANEOUS) ×1 IMPLANT
ELECTRODE REM PT RTRN 9FT ADLT (ELECTROSURGICAL) ×1 IMPLANT
EVACUATOR SILICONE 100CC (DRAIN) ×3 IMPLANT
FLOSEAL (HEMOSTASIS) IMPLANT
GLOVE BIO SURGEON STRL SZ 6.5 (GLOVE) ×2 IMPLANT
GLOVE BIO SURGEONS STRL SZ 6.5 (GLOVE) ×1
GLOVE BIOGEL PI IND STRL 6.5 (GLOVE) ×1 IMPLANT
GLOVE BIOGEL PI IND STRL 8.5 (GLOVE) ×1 IMPLANT
GLOVE BIOGEL PI INDICATOR 6.5 (GLOVE) ×2
GLOVE BIOGEL PI INDICATOR 8.5 (GLOVE) ×2
GLOVE SS BIOGEL STRL SZ 8.5 (GLOVE) ×1 IMPLANT
GLOVE SUPERSENSE BIOGEL SZ 8.5 (GLOVE) ×2
GLOVE SURG SS PI 6.5 STRL IVOR (GLOVE) ×6 IMPLANT
GOWN STRL REUS W/ TWL LRG LVL3 (GOWN DISPOSABLE) ×1 IMPLANT
GOWN STRL REUS W/TWL 2XL LVL3 (GOWN DISPOSABLE) ×6 IMPLANT
GOWN STRL REUS W/TWL LRG LVL3 (GOWN DISPOSABLE) ×3
KIT BASIN OR (CUSTOM PROCEDURE TRAY) ×3 IMPLANT
MIX DBX 10CC 35% BONE (Bone Implant) ×3 IMPLANT
NEEDLE 22X1 1/2 (OR ONLY) (NEEDLE) ×3 IMPLANT
NEEDLE SPNL 18GX3.5 QUINCKE PK (NEEDLE) ×6 IMPLANT
NS IRRIG 1000ML POUR BTL (IV SOLUTION) ×9 IMPLANT
PACK LAMINECTOMY ORTHO (CUSTOM PROCEDURE TRAY) ×3 IMPLANT
PACK UNIVERSAL I (CUSTOM PROCEDURE TRAY) ×3 IMPLANT
PATTIES SURGICAL .5 X.5 (GAUZE/BANDAGES/DRESSINGS) IMPLANT
PATTIES SURGICAL .5 X1 (DISPOSABLE) ×3 IMPLANT
SPONGE LAP 18X18 X RAY DECT (DISPOSABLE) ×3 IMPLANT
SPONGE LAP 4X18 X RAY DECT (DISPOSABLE) ×3 IMPLANT
SPONGE SURGIFOAM ABS GEL 100 (HEMOSTASIS) ×3 IMPLANT
STAPLER VISISTAT 35W (STAPLE) IMPLANT
STRIP CLOSURE SKIN 1/2X4 (GAUZE/BANDAGES/DRESSINGS) IMPLANT
SURGIFLO W/THROMBIN 8M KIT (HEMOSTASIS) ×3 IMPLANT
SUT BONE WAX W31G (SUTURE) ×3 IMPLANT
SUT ETHILON 3 0 PS 1 (SUTURE) ×3 IMPLANT
SUT MNCRL AB 3-0 PS2 18 (SUTURE) ×6 IMPLANT
SUT MON AB 3-0 SH 27 (SUTURE) ×3
SUT MON AB 3-0 SH27 (SUTURE) ×1 IMPLANT
SUT VIC AB 0 CT1 27 (SUTURE) ×3
SUT VIC AB 0 CT1 27XBRD ANBCTR (SUTURE) ×1 IMPLANT
SUT VIC AB 1 CT1 18XCR BRD 8 (SUTURE) ×2 IMPLANT
SUT VIC AB 1 CT1 27 (SUTURE)
SUT VIC AB 1 CT1 27XBRD ANTBC (SUTURE) IMPLANT
SUT VIC AB 1 CT1 8-18 (SUTURE) ×4
SUT VIC AB 2-0 CT1 18 (SUTURE) ×6 IMPLANT
SUT VICRYL 0 UR6 27IN ABS (SUTURE) IMPLANT
SYR BULB IRRIGATION 50ML (SYRINGE) ×3 IMPLANT
SYR CONTROL 10ML LL (SYRINGE) ×6 IMPLANT
TOWEL OR 17X26 10 PK STRL BLUE (TOWEL DISPOSABLE) IMPLANT
TRAY FOLEY W/METER SILVER 16FR (SET/KITS/TRAYS/PACK) ×3 IMPLANT
WATER STERILE IRR 1000ML POUR (IV SOLUTION) IMPLANT
YANKAUER SUCT BULB TIP NO VENT (SUCTIONS) ×3 IMPLANT

## 2016-09-11 NOTE — Evaluation (Signed)
Physical Therapy Evaluation Patient Details Name: Shelia [name] MRN: [id number] DOB: 12/24/1945 Today's Date: 09/11/2016   History of Present Illness  Pt is a 71 yo female admitted on 09/11/16 for L3-L5 decompression & fusion with removeal of hardware at L5-S1. PMH significant for lumbosacral DDD, sciatica R, spinal stenosis, spondylolithesis lumbar, cervicalgia, CTS, HTN, Depression, GERD, HLD, IBS, Spinal surgery 2012.   Clinical Impression  Pt is POD 0 following the above procedure. Prior to admission, pt lived with her husband in a single level home and was completely independent. Pt requires Min guard to Min A with mobility this session and becomes nauseated and flushed with standing and gait activities. Pt is able to return to bed and lay supine without any vomiting. RN notified. Pt will benefit from continued acute PT follow-up in order to address the below deficits including increasing gait distance and stair negotiation prior to discharge.    Follow Up Recommendations No PT follow up    Equipment Recommendations  None recommended by PT    Recommendations for Other Services       Precautions / Restrictions Precautions Precautions: Back Precaution Booklet Issued: Yes (comment) Precaution Comments: Pt able to recall 2.3 precautions. Handout left  Required Braces or Orthoses: Spinal Brace Spinal Brace: Lumbar corset;Applied in sitting position Restrictions Weight Bearing Restrictions: No      Mobility  Bed Mobility Overal bed mobility: Needs Assistance Bed Mobility: Rolling;Sidelying to Sit;Sit to Sidelying Rolling: Min guard Sidelying to sit: Min guard     Sit to sidelying: Min guard General bed mobility comments: Min guard with use of railing to get EOB  Transfers Overall transfer level: Needs assistance Equipment used: Rolling walker (2 wheeled) Transfers: Sit to/from Stand Sit to Stand: Min guard         General transfer comment: Min guard for safety  from EOB to RW  Ambulation/Gait Ambulation/Gait assistance: Min assist Ambulation Distance (Feet): 20 Feet Assistive device: Rolling walker (2 wheeled) Gait Pattern/deviations: Step-through pattern;Decreased step length - right;Decreased step length - left Gait velocity: decreased Gait velocity interpretation: Below normal speed for age/gender General Gait Details: slow gait with cues for sequencing. Requires cues for positioning within RW and to handle lines.   Stairs            Wheelchair Mobility    Modified Rankin (Stroke Patients Only)       Balance Overall balance assessment: Needs assistance Sitting-balance support: No upper extremity supported;Feet supported Sitting balance-Leahy Scale: Fair     Standing balance support: Bilateral upper extremity supported;No upper extremity supported Standing balance-Leahy Scale: Fair                               Pertinent Vitals/Pain Pain Assessment: 0-10 Pain Score: 8  Pain Location: low back Pain Descriptors / Indicators: Aching;Grimacing;Guarding Pain Intervention(s): Monitored during session;Limited activity within patient's tolerance;Repositioned;Patient requesting pain meds-RN notified;Ice applied    Home Living Family/patient expects to be discharged to:: Private residence Living Arrangements: Spouse/significant other Available Help at Discharge: Family;Available 24 hours/day Type of Home: House Home Access: Stairs to enter Entrance Stairs-Rails: Psychiatric nurse of Steps: 3 Home Layout: One level Home Equipment: Walker - 2 wheels;Bedside commode;Grab bars - toilet;Grab bars - tub/shower;Hand held shower head;Shower seat      Prior Function Level of Independence: Independent         Comments: completely independent and doing all her own ADLs  Hand Dominance   Dominant Hand: Left    Extremity/Trunk Assessment   Upper Extremity Assessment Upper Extremity  Assessment: Defer to OT evaluation    Lower Extremity Assessment Lower Extremity Assessment: Generalized weakness    Cervical / Trunk Assessment Cervical / Trunk Assessment: Normal  Communication   Communication: No difficulties  Cognition Arousal/Alertness: Lethargic;Suspect due to medications Behavior During Therapy: [facility] for tasks assessed/performed Overall Cognitive Status: Within Functional Limits for tasks assessed                                        General Comments      Exercises     Assessment/Plan    PT Assessment Patient needs continued PT services  PT Problem List Decreased activity tolerance;Decreased balance;Decreased mobility;Decreased knowledge of use of DME;Pain       PT Treatment Interventions DME instruction;Gait training;Stair training;Functional mobility training;Therapeutic activities;Therapeutic exercise;Balance training;Patient/family education    PT Goals (Current goals can be found in the Care Plan section)  Acute Rehab PT Goals Patient Stated Goal: to get home and have less pain PT Goal Formulation: With patient/family Time For Goal Achievement: 09/18/16 Potential to Achieve Goals: Good    Frequency Min 5X/week   Barriers to discharge        Co-evaluation               AM-PAC PT "6 Clicks" Daily Activity  Outcome Measure Difficulty turning over in bed (including adjusting bedclothes, sheets and blankets)?: None Difficulty moving from lying on back to sitting on the side of the bed? : A Little Difficulty sitting down on and standing up from a chair with arms (e.g., wheelchair, bedside commode, etc,.)?: Total Help needed moving to and from a bed to chair (including a wheelchair)?: A Little Help needed walking in hospital room?: A Little Help needed climbing 3-5 steps with a railing? : A Lot 6 Click Score: 16    End of Session Equipment Utilized During Treatment: Gait belt;Back brace Activity Tolerance: Patient  limited by lethargy Patient left: in bed;with call bell/phone within reach;with family/visitor present Nurse Communication: Mobility status;Patient requests pain meds PT Visit Diagnosis: Difficulty in walking, not elsewhere classified (R26.2);Pain Pain - Right/Left:  (central) Pain - part of body:  (lumbar spine)    Time: 0962-8366 PT Time Calculation (min) (ACUTE ONLY): 37 min   Charges:   PT Evaluation $PT Eval Low Complexity: 1 Procedure PT Treatments $Gait Training: 8-22 mins   PT G Codes:        Scheryl Marten PT, DPT  [phone]   Jacqulyn Liner Sloan Leiter 09/11/2016, 4:40 PM

## 2016-09-11 NOTE — Anesthesia Preprocedure Evaluation (Cosign Needed Addendum)
Anesthesia Evaluation  Patient identified by MRN, date of birth, ID band Patient awake    Reviewed: Allergy & Precautions, NPO status , Patient's Chart, lab work & pertinent test results  History of Anesthesia Complications (+) PONV and history of anesthetic complications  Airway Mallampati: II  TM Distance: >3 FB Neck ROM: Full    Dental  (+) Teeth Intact, Dental Advisory Given   Pulmonary    breath sounds clear to auscultation       Cardiovascular hypertension, Pt. on medications  Rhythm:Regular Rate:Normal     Neuro/Psych    GI/Hepatic GERD  Medicated,  Endo/Other    Renal/GU      Musculoskeletal   Abdominal   Peds  Hematology   Anesthesia Other Findings   Reproductive/Obstetrics                            Anesthesia Physical Anesthesia Plan  ASA: II  Anesthesia Plan: General   Post-op Pain Management:    Induction: Intravenous  PONV Risk Score and Plan: Ondansetron and Dexamethasone  Airway Management Planned: Oral ETT  Additional Equipment:   Intra-op Plan:   Post-operative Plan: Extubation in OR  Informed Consent: I have reviewed the patients History and Physical, chart, labs and discussed the procedure including the risks, benefits and alternatives for the proposed anesthesia with the patient or authorized representative who has indicated his/her understanding and acceptance.   Dental advisory given  Plan Discussed with: CRNA and Anesthesiologist  Anesthesia Plan Comments:         Anesthesia Quick Evaluation

## 2016-09-11 NOTE — H&P (Signed)
History of Present Illness  The patient is a 71 year old female who comes in today for a preoperative History and Physical. The patient is scheduled for a L3-5 decompression and insitu fusion, possible hardware removal L5-S1 to be performed by Dr. Duane Lope D. Rolena Infante, MD at [facility] on 09-11-16 . Please see the hospital record for complete dictated history and physical. Pt reports a hx of good health.   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 First Degree Relatives  reported Heart disease in female family member before age 43  Heart disease in female family member before age 57  Hypertension  Father, Mother.  Social History  Tobacco use  Never smoker. 05/23/2016 Children  2 Current work status  retired Furniture conservator/restorer daily; does running / walking Living situation  live with spouse Marital status  married Never consumed alcohol  05/23/2016: Never consumed alcohol No history of drug/alcohol rehab  Not under pain contract  Number of flights of stairs before winded  less than 1 Tobacco / smoke exposure  05/23/2016: no  Medication History  Neurontin (100MG  Capsule, 1 Oral bid, Taken starting 08/15/2016) Active. (rx sernt to [facility] at [place] per ddb/smt 08/15/16) CeleBREX (200MG  Capsule, 1 (one) Capsule Oral daily pc, Taken starting 05/23/2016) Active. (jcb/jmb/ssj) Neurontin (300MG  Capsule, 1 (one) Capsule Oral 1 po qhs, Taken starting 08/15/2016) Active. (rx sent to [facility] at [place]  per ddb/smt 08/15/16) Hoy Register Menopause Relief (Oral) Active. (qd) Ambien (10MG  Tablet, Oral) Active. (qd) Losartan Potassium-HCTZ (100-25MG  Tablet, Oral) Active. Triamterene-HCTZ (37.5-25MG  Tablet, Oral) Active. Vitamin D (1000UNIT Tablet, Oral) Active. Aspirin (81MG  Tablet, Oral) Active. Potassium (99MG  Tablet, Oral) Active. Fish Oil (Oral) Specific strength unknown - Active. Tylenol Arthritis Pain (650MG  Tablet ER, Oral) Active. Medications Reconciled  Past Surgical History  Gallbladder Surgery  laporoscopic Hysterectomy  partial (non-cancerous) Neck Disc Surgery  Spinal Surgery  Tonsillectomy   Other Problems Chronic Cystitis  High blood pressure  Irritable bowel syndrome   Vitals  09/02/2016 8:57 AM Weight: 165 lb Height: 67.5in Body Surface Area: 1.87 m Body Mass Index: 25.46 kg/m  Temp.: 98.42F(Oral)  Pulse: 90 (Regular)  BP: 141/83 (Sitting, Left Arm, Standard)  General General Appearance-Not in acute distress. Orientation-Oriented X3. Build & Nutrition-Well nourished and Well developed.  Integumentary General Characteristics Surgical Scars - surgical scarring consistent with previous lumbar surgery. Lumbar Spine-Skin examination of the lumbar spine is without deformity, skin lesions, lacerations or abrasions.  Chest and Lung Exam Auscultation Breath sounds - Normal and Clear.  Cardiovascular Auscultation Rhythm - Regular rate and rhythm.  Abdomen Palpation/Percussion Palpation and Percussion of the abdomen reveal - Soft, Non Tender and No Rebound tenderness.  Peripheral Vascular Lower Extremity Palpation - Posterior tibial pulse - Bilateral - 2+. Dorsalis pedis pulse - Bilateral - 2+.  Neurologic Sensation Lower Extremity - Bilateral - sensation is intact in the lower extremity. Reflexes Patellar Reflex - Bilateral - 2+. Achilles Reflex - Bilateral - 2+. Clonus - Bilateral - clonus not present. Hoffman's Sign  - Bilateral - Hoffman's sign not present. Testing Seated Straight Leg Raise - Right - Seated straight leg raise positive.  Musculoskeletal Spine/Ribs/Pelvis  Lumbosacral Spine: Inspection and Palpation - Tenderness - left lumbar paraspinals tender to palpation and right lumbar paraspinals tender to palpation. Strength and Tone: Strength - Hip Flexion - Bilateral - 5/5. Knee Extension - Bilateral - 5/5. Knee Flexion - Bilateral - 5/5. Ankle Dorsiflexion - Bilateral - 4/5. Ankle Plantarflexion - Bilateral - 5/5. Heel walk - Bilateral - unable to heel walk. Toe Walk - Bilateral - able to walk on toes with moderate difficulty. Heel-Toe Walk - Bilateral - able to heel-toe walk without difficulty. ROM - Flexion - moderately decreased range of motion. Extension - moderately decreased range of motion and painful. Left Lateral Bending - moderately decreased range of motion and painful. Right Lateral Bending - moderately decreased range of motion and painful. Right Rotation - moderately decreased range of motion and painful. Left Rotation - moderately decreased range of motion and painful. Pain - neither flexion or extension is more painful than the other. Lumbosacral Spine - Waddell's Signs - no Waddell's signs present. Lower Extremity Range of Motion - No true hip, knee or ankle pain with range of motion. Gait and Station - [employer] - no assistive devices.  The patient's MRI was done in April 2018, significant at L3-4 for moderate disk bulging indenting the anterior thecal sac, thickening of ligamentum flavum at that level, moderate canal stenosis, moderate bilateral foraminal narrowing. Disk bulge is more asymmetric into the left, but does not displace the left L3 dorsal root ganglion and L4-5 mild disk bulge, indenting thecal sac, superimposed moderate sized left cranial disk extrusion, severely narrows the left neural foramen encroaches on the left L4 dorsal root ganglion, moderate to severe bilateral  facet arthrosis, prominent thickening of ligamentum flavum. The patient does primarily complain of right leg pain as well as dorsiflexion weakness noted on physical exam.   CT scan confirms the severe spinal stenosis at L4-5. Moderate to significant stenosis 3-4. No significant stenosis at the fused 5-1 level. There is the superior portion of the L5 posterior arch is still intact. Pedicle screw and her trajectory seems to be fine. The CT scan also demonstrates that the grade 1 anterolisthesis at L4-5 is new since the previous CT scan done on 01/11/2014.  Plan: At this point in time, I do think that the principal source of her problem is the spinal stenosis at 4-5 and to a lesser degree 3-4. My recommendation is a decompression 3-4, 4-5 with an in-situ fusion at L4-5, possibly requiring the removal of the hardware. This would allow me to address the principal problem of spinal stenosis. She has a well maintained disc height at L4-5 and so I do not think interbody fusion is going to be beneficial to her. I do not think indirect decompression of the spinal stenosis will be accomplished because she already has a well maintained disc height. Therefore, I think her principal problem is the spinal stenosis which could be addressed with the decompression. However, the grade 1 slip does concern me as it can progress after the decompression. Given her age and her activity level I think the best course of action is a straightforward in-situ fusion. This would prevent the need for instrumentation and give her the best chance of remaining stable and not having the slip progress. I have reviewed this with the patient and her husband. All of her questions were addressed. We have talked about the risks which include infection, bleeding, nerve damage, death, stroke, paralysis, ongoing or worse pain, need for additional surgery, leak of spinal  fluid. All of their questions were addressed. We will get preoperative medical  clearance and move forward with surgery in the near future.  Assessment & Plan Goal Of Surgery: Discussed that goal of surgery is to reduce pain and improve function and quality of life. Patient is aware that despite all appropriate treatment that there pain and function could be the same, worse, or different.  Posterior Lumbar Decompression/disectomy: Risks of surgery include infection, bleeding, nerve damage, death, stroke, paralysis, failure to heal, need for further surgery, ongoing or worse pain, need for further surgery, CSF leak, loss of bowel or bladder, and recurrent disc herniation or Stenosis which would necessitate need for further surgery.

## 2016-09-11 NOTE — Op Note (Signed)
Operative note.  Preoperative diagnosis. Adjacent segment lumbar spinal stenosis L3-4 L4-5. Multiple previous L5-S1 decompression and instrumented fusion.  Postoperative diagnosis. Same.  Operate procedure. Removal of hardware L5-S1. Lumbar decompression L3-4 L4-5. In situ fusion L3-L5.  First Environmental consultant. [facility].  Complications. None.  Intraoperative findings. Significant thickening of the ligamentum flavum causing adjacent segment spinal stenosis. This was consistent with preoperative imaging studies. Elected to remove the pedicle screw construct as it was affecting her ability to place the posterior lateral bone graft. Due to the significant stenosis I did have concern about instability at L3-4 and so I elected to perform an in situ fusion from L3-L5. Valsalva maneuver at the conclusion of the case was unremarkable. No CSF leak noted.  Indications. This is a pleasant 71 year old who is had multiple previous lumbar decompression discectomy was3 S1 instrumented fusion. Patient has done well until recently and she is referred back buttock and bilateral leg right sides worse than the left. Attempts at conservative management failed to her symptoms and so she elected to proceed with surgery. Preoperative imaging studies demonstrated adjacent segment 5-3/4 spinal stenosis and L4-5 spondylolisthesis. As a result of the severe pain, loss of quality of life we elected to proceed with surgery. All risks benefits and alternatives were discussed with the patient and her husband.  Operative note. Patient was brought to the operating room placed on the operating table. After successful induction of general anesthesia and endotracheal intubation teds, SCDs were applied and a Foley inserted. Patient was turned prone onto the Wilson frame and all bony prominences were well-padded. The back was then prepped and draped in a standard fashion. Timeout was taken to confirm patient procedure and all other important  data.  HEENT release incision site was felt to be slightly off to the right and so I made a new incision spanning from L3 down to S1. Sharp dissection was carried out down to the deep fascia. Deep fascia sharply incised. And I stripped the paraspinal muscles to expose the L3 spinous process and lamina and facet complex. Based on preoperative CT scan in either at the L4 posterior elements were intact and the superior portion of the L5 posterior elements were intact. I then dissected the L4 spinous process and stripped the paraspinal muscles. At this point I exposed the posterior hardware L5 and S1. The locking caps rod and screws were ultimately removed. He was no significant bleeding from the pedicle screw sites. Using a curette I began removing the significant scar tissue at the L4-5 junction. I was able to palpate the remaining portion of the L5 posterior elements and used this as my guide to prevent iatrogenic injury to the dura. Once I completely expose the L4 for 5 intervertebral space I then used a small, curet to develop a plane underneath the L4 lamina. Using my 3 mm Kerrison punch I resected the ligamentum flavum as well as the lamina of L4. At this point I noted a significant amount of thickened ligamentum flavum causing marked spinal stenosis. This was consistent with preoperative MRI. Using my Penfield 4 I dissected through the very extensive ligamentum flavum until I could create a plane between the thecal sac and the ligamentum flavum using my 3 mm Kerrison punch I completed the central decompression by removing the ligamentum flavum. Once I could see the dura I then gently began dissecting the scar tissue off of the dura and proceeding into the lateral recesses. Using a Kerrison punch I resected the overhanging osteophyte and  ligamentum flavum in the lateral recess at this point with the central 5 decompression complete I then went and using the same technique performed a central decompression L3-4  again a generous laminotomy was performed with Kerrison punches Penfield 4 was used to dissect through the ligamentum flavum and the Kerrison punches were used to remove the ligamentum flavum I now had a remaining bridge of L4 lamina. I then dissected superior caudal and cranial and free the remaining fragment of L4 lamina. I then used my 3 mm Kerrison punch to excise this. At this point I now had an adequate 3 to L5 decompression. I then carried my dissection into the lateral recess to ensure that complete decompression. I was able to palpate from the inferior aspect of the L3 pedicle to the inferior aspect of the L5 pedicle I used my Woodson elevator to ensure that the inferior decompression was adequate as were the foraminotomies. At this point onset bilateral decompression I noted the thecal sac reexpanded and was no longer under any undue compression.  At this point I then dissected out exposed the L3 transverse process and the L4 transverse process. I then removed the remaining facet capsule L4-5 level and using high-speed bur were decorticated the transverse process and portion in the lateral portion of the facet. I then placed autograft bone mixed with DBX mix and vivigen into the posterior lateral gutter. Once bone graft was properly seated and I then irrigated Copes of normal saline. Using bipolar cautery and FloSeal I made sure hemostasis I did place a deep drain was brought out through a separate stab incision. At this point I checked one last time to ensure my decompression was adequate bilaterally. A Valsalva maneuver to 40 mmHg was performed and there was no CSF leak noted.  The wound was then closed in layered fashion with interrupted #1 Vicryl suture 2-0 Vicryl suture and 3-0 Monocryl. The drain was stitched in with a #1 nylon. Steri-Strips and a dry dressing were applied and the patient was ultimately extubated transferred the PACU that incident. The end of the case all needle sponge counts  were correct. There are no adverse intraoperative events.

## 2016-09-11 NOTE — Brief Op Note (Signed)
09/11/2016  11:33 AM  PATIENT:  Shelia [name]  71 y.o. female  PRE-OPERATIVE DIAGNOSIS:  Adjacent segment lumbar spinal stenosis  POST-OPERATIVE DIAGNOSIS:  Adjacent segment lumbar spinal stenosis  PROCEDURE:  Procedure(s) with comments: Lumbar three-five decompression, insitu fusion Lumbar fhree-five,  removal of lumbar hardware Lumbar five-Sacral one (N/A) - 3.5 hrs  SURGEON:  Surgeon(s) and Role:    Melina Schools, MD - Primary  PHYSICIAN ASSISTANT:   ASSISTANTS: carmen mayo   ANESTHESIA:   general  EBL:  Total I/O In: 1250 [I.V.:1000; IV Piggyback:250] Out: 690 [Urine:190; Blood:500]  BLOOD ADMINISTERED:none  DRAINS: 1 posterior back drain (stitched in)   LOCAL MEDICATIONS USED:  MARCAINE     SPECIMEN:  Source of Specimen:  post spine hardware  DISPOSITION OF SPECIMEN:  PATHOLOGY  COUNTS:  YES  TOURNIQUET:  * No tourniquets in log *  DICTATION: .Dragon Dictation  PLAN OF CARE: Admit to inpatient   PATIENT DISPOSITION:  PACU - hemodynamically stable.

## 2016-09-11 NOTE — Transfer of Care (Signed)
Immediate Anesthesia Transfer of Care Note  Patient: Shelia [name]  Procedure(s) Performed: Procedure(s) with comments: Lumbar three-five decompression, insitu fusion Lumbar fhree-five,  removal of lumbar hardware Lumbar five-Sacral one (N/A) - 3.5 hrs  Patient Location: PACU  Anesthesia Type:General  Level of Consciousness: oriented, drowsy and patient cooperative  Airway & Oxygen Therapy: Patient Spontanous Breathing and Patient connected to nasal cannula oxygen  Post-op Assessment: Report given to RN and Post -op Vital signs reviewed and stable  Post vital signs: Reviewed  Last Vitals:  Vitals:   09/11/16 0551 09/11/16 0552  BP:  (!) 150/72  Pulse: 83   Resp: 18   Temp: 36.9 C     Last Pain:  Vitals:   09/11/16 0551  TempSrc: Oral      Patients Stated Pain Goal: 3 (84/53/64 [id number])  Complications: No apparent anesthesia complications

## 2016-09-11 NOTE — Anesthesia Procedure Notes (Cosign Needed)
Procedure Name: Intubation Date/Time: 09/11/2016 7:42 AM Performed by: Linna Caprice, DAVID Pre-anesthesia Checklist: Patient identified, Emergency Drugs available, Patient being monitored and Suction available Patient Re-evaluated:Patient Re-evaluated prior to induction Oxygen Delivery Method: Circle system utilized Preoxygenation: Pre-oxygenation with 100% oxygen Induction Type: IV induction Ventilation: Mask ventilation without difficulty Laryngoscope Size: Miller and 2 Grade View: Grade I Tube type: Oral Tube size: 7.0 mm Number of attempts: 1 Airway Equipment and Method: Stylet Placement Confirmation: ETT inserted through vocal cords under direct vision and positive ETCO2 Secured at: 21 cm Tube secured with: Tape Dental Injury: Teeth and Oropharynx as per pre-operative assessment

## 2016-09-12 ENCOUNTER — Inpatient Hospital Stay (HOSPITAL_COMMUNITY): Payer: Medicare Other

## 2016-09-12 ENCOUNTER — Encounter (HOSPITAL_COMMUNITY): Payer: Self-pay | Admitting: Orthopedic Surgery

## 2016-09-12 MED ORDER — METHOCARBAMOL 500 MG PO TABS: 500.0000 mg | ORAL_TABLET | Freq: Three times a day (TID) | ORAL | 0 refills | Status: DC

## 2016-09-12 MED ORDER — ONDANSETRON HCL 4 MG PO TABS: 4.0000 mg | ORAL_TABLET | Freq: Four times a day (QID) | ORAL | 0 refills | Status: DC | PRN

## 2016-09-12 MED ORDER — OXYCODONE HCL 10 MG PO TABS: 10.0000 mg | ORAL_TABLET | ORAL | 0 refills | Status: DC | PRN

## 2016-09-12 NOTE — Progress Notes (Signed)
Occupational Therapy Evaluation Patient Details Name: Shelia [name] MRN: [id number] DOB: [birth year] Today's Date: 09/12/2016    History of Present Illness Pt is a 71 yo female admitted on 09/11/16 for L3-L5 decompression & fusion with removeal of hardware at L5-S1. PMH significant for lumbosacral DDD, sciatica R, spinal stenosis, spondylolithesis lumbar, cervicalgia, CTS, HTN, Depression, GERD, HLD, IBS, Spinal surgery 2012.    Clinical Impression   PTA, pt independent with ADL and mobility the patient with decline in functional status due to deficits below. Will follow acutely to complete education regarding compensatory techniques, use of AE/DME to maximize functional level of independence and reduce risk of falls. Pt making good progress.     Follow Up Recommendations  No OT follow up;Supervision - Intermittent    Equipment Recommendations  None recommended by OT    Recommendations for Other Services       Precautions / Restrictions Precautions Precautions: Back;Fall Precaution Booklet Issued: Yes (comment) Precaution Comments: pt issued handout. Began reviewing precuations Required Braces or Orthoses: Spinal Brace Spinal Brace: Lumbar corset;Applied in sitting position Restrictions Weight Bearing Restrictions: No      Mobility Bed Mobility Overal bed mobility: Needs Assistance Bed Mobility: Rolling;Sit to Sidelying Rolling: Modified independent (Device/Increase time) Sidelying to sit: Min guard     Sit to sidelying: Min assist General bed mobility comments: VC for technique. Educated pt to work on bed mobility with bed flat without use of Rails  Transfers Overall transfer level: Needs assistance Equipment used: Rolling walker (2 wheeled) Transfers: Sit to/from Stand Sit to Stand: Min guard         General transfer comment: vc tfor technique    Balance Overall balance assessment: Needs assistance Sitting-balance support: No upper extremity supported;Feet  supported Sitting balance-Leahy Scale: Fair     Standing balance support: Bilateral upper extremity supported;No upper extremity supported Standing balance-Leahy Scale: Fair                             ADL either performed or assessed with clinical judgement   ADL Overall ADL's : Needs assistance/impaired     Grooming: Supervision/safety;Set up;Sitting   Upper Body Bathing: Supervision/ safety;Set up;Sitting   Lower Body Bathing: Moderate assistance;Sit to/from stand   Upper Body Dressing : Minimal assistance;Sitting Upper Body Dressing Details (indicate cue type and reason): A to donn brace Lower Body Dressing: Moderate assistance;Sit to/from stand   Toilet Transfer: Min guard;RW;Ambulation   Toileting- Water quality scientist and Hygiene: Moderate assistance;Sit to/from stand       Functional mobility during ADLs: Min guard;Rolling walker;Cueing for safety General ADL Comments: Began education regarding compensatory techniques and use of AE and DME for ADL and functional mobility. Pt would most likely benefit from AE. Pt has reacher at [employer]. Husband plans to reutrn to work next week. Educated pt/family on home set up to increased independence, reduce risk of falls and adhere to back precautions.      Vision         Perception     Praxis      Pertinent Vitals/Pain Pain Assessment: 0-10 Pain Score: 6  Faces Pain Scale: Hurts little more Pain Location: low back Pain Descriptors / Indicators: Aching;Grimacing;Guarding Pain Intervention(s): Limited activity within patient's tolerance;Patient requesting pain meds-RN notified     Hand Dominance Left   Extremity/Trunk Assessment Upper Extremity Assessment Upper Extremity Assessment: Overall WFL for tasks assessed   Lower Extremity Assessment Lower Extremity Assessment: Defer to  PT evaluation   Cervical / Trunk Assessment Cervical / Trunk Assessment: Normal   Communication  Communication Communication: No difficulties   Cognition Arousal/Alertness: Awake/alert Behavior During Therapy: WFL for tasks assessed/performed Overall Cognitive Status: Within Functional Limits for tasks assessed                                     General Comments       Exercises     Shoulder Instructions      Home Living Family/patient expects to be discharged to:: Private residence Living Arrangements: Spouse/significant other Available Help at Discharge: Family;Available 24 hours/day Type of Home: House Home Access: Stairs to enter [employer] of Steps: 3 Entrance Stairs-Rails: Right;Left Home Layout: One level     Bathroom Shower/Tub: Teacher, early years/pre: Handicapped height Bathroom Accessibility: Yes How Accessible: Accessible via walker Home Equipment: [place] - 2 wheels;Bedside commode;Grab bars - toilet;Grab bars - tub/shower;Hand held shower head;Shower seat Management consultant)          Prior Functioning/Environment Level of Independence: Independent        Comments: completely independent and doing all her own ADLs         OT Problem List: Decreased activity tolerance;Decreased knowledge of use of DME or AE;Decreased knowledge of precautions;Pain      OT Treatment/Interventions: Self-care/ADL training;DME and/or AE instruction;Therapeutic activities;Patient/family education    OT Goals(Current goals can be found in the care plan section) Acute Rehab OT Goals Patient Stated Goal: to get home and have less pain OT Goal Formulation: With patient Time For Goal Achievement: 09/19/16 Potential to Achieve Goals: Good ADL Goals Pt Will Perform Lower Body Bathing: with supervision;with caregiver independent in assisting;with adaptive equipment;sit to/from stand Pt Will Perform Lower Body Dressing: with supervision;with caregiver independent in assisting;sit to/from stand;with adaptive equipment Pt Will Transfer to Toilet:  with modified independence;ambulating;bedside commode Pt Will Perform Toileting - Clothing Manipulation and hygiene: with modified independence;sit to/from stand;with adaptive equipment;sitting/lateral leans Pt Will Perform Tub/Shower Transfer: with min guard assist;with caregiver independent in assisting;ambulating;rolling walker;grab bars (with husband independetn in assisting) Additional ADL Goal #1: Pt will independently verbalize 3/3/ back precautions Additional ADL Goal #2: Pt will independently verbalize 3 strategies to reduce risk of falls at home.  OT Frequency: Min 3X/week   Barriers to D/C:            Co-evaluation              AM-PAC PT "6 Clicks" Daily Activity     Outcome Measure Help from another person eating meals?: None Help from another person taking care of personal grooming?: A Little Help from another person toileting, which includes using toliet, bedpan, or urinal?: A Little Help from another person bathing (including washing, rinsing, drying)?: A Little Help from another person to put on and taking off regular upper body clothing?: A Little Help from another person to put on and taking off regular lower body clothing?: A Lot 6 Click Score: 18   End of Session Equipment Utilized During Treatment: Rolling walker;Back brace Nurse Communication: Mobility status  Activity Tolerance: Patient tolerated treatment well Patient left: in chair;with call bell/phone within reach;with family/visitor present  OT Visit Diagnosis: Unsteadiness on feet (R26.81);Muscle weakness (generalized) (M62.81);Pain Pain - part of body:  (back)                Time: 0865-7846 OT Time Calculation (min): 24  min Charges:  OT General Charges $OT Visit: 1 Procedure OT Evaluation $OT Eval Moderate Complexity: 1 Procedure OT Treatments $Self Care/Home Management : 8-22 mins G-Codes:     [facility], OT/L  [phone] 09/12/2016  Leary Mcnulty,HILLARY 09/12/2016, 11:20 AM

## 2016-09-12 NOTE — Anesthesia Postprocedure Evaluation (Signed)
Anesthesia Post Note  Patient: Shelia [name]  Procedure(s) Performed: Procedure(s) (LRB): Lumbar three-five decompression, insitu fusion Lumbar fhree-five,  removal of lumbar hardware Lumbar five-Sacral one (N/A)     Patient location during evaluation: PACU Anesthesia Type: General Level of consciousness: awake, awake and alert and oriented Pain management: pain level controlled Vital Signs Assessment: post-procedure vital signs reviewed and stable Respiratory status: spontaneous breathing, nonlabored ventilation and respiratory function stable Cardiovascular status: blood pressure returned to baseline Anesthetic complications: no    Last Vitals:  Vitals:   09/12/16 0400 09/12/16 0759  BP: (!) 121/53 (!) 126/49  Pulse: 85 78  Resp: 18 18  Temp: 36.7 C 37.4 C    Last Pain:  Vitals:   09/12/16 0856  TempSrc:   PainSc: 6                  Jhordan Mckibben COKER

## 2016-09-12 NOTE — Progress Notes (Signed)
Physical Therapy Treatment Patient Details Name: Shelia [name] MRN: [id number] DOB: [birth year] Today's Date: 09/12/2016    History of Present Illness Pt is a 71 yo female admitted on 09/11/16 for L3-L5 decompression & fusion with removeal of hardware at L5-S1. PMH significant for lumbosacral DDD, sciatica R, spinal stenosis, spondylolithesis lumbar, cervicalgia, CTS, HTN, Depression, GERD, HLD, IBS, Spinal surgery 2012.     PT Comments    Pt progressing towards physical therapy goals. Was able to perform transfers and ambulation with gross min guard assist for balance support to supervision for safety. Pt and family were educated on precautions, brace application/wearing schedule, stair negotiation, and general safety with activity progression.   Follow Up Recommendations  No PT follow up     Equipment Recommendations  None recommended by PT    Recommendations for Other Services       Precautions / Restrictions Precautions Precautions: Back;Fall Precaution Booklet Issued: Yes (comment) Precaution Comments: Pt able to recall 2.3 precautions. Handout left  Required Braces or Orthoses: Spinal Brace Spinal Brace: Lumbar corset;Applied in sitting position Restrictions Weight Bearing Restrictions: No    Mobility  Bed Mobility Overal bed mobility: Needs Assistance Bed Mobility: Rolling;Sit to Sidelying Rolling: Modified independent (Device/Increase time)       Sit to sidelying: Min assist General bed mobility comments: VC's for log roll technique. Pt had difficulty scooting back on an elevated bed (raised to simulate home environment). Assist was required to elevate LE's into bed as well. Pt does have a lower adjustable bed she could use at home if needbe.   Transfers Overall transfer level: Needs assistance Equipment used: Rolling walker (2 wheeled) Transfers: Sit to/from Stand Sit to Stand: Min guard         General transfer comment: Close guard for safety as pt  powered-up to full standing position. VC's for hand placement on seated surface for safety, and to maintain precautions as pt twisting to look behind her at the chair.   Ambulation/Gait Ambulation/Gait assistance: Min guard;Supervision Ambulation Distance (Feet): 400 Feet Assistive device: Rolling walker (2 wheeled) Gait Pattern/deviations: Step-through pattern;Decreased step length - right;Decreased step length - left Gait velocity: Decreased Gait velocity interpretation: Below normal speed for age/gender General Gait Details: Slow but generally steady with RW for support. Initially pt requiring min guard assist however progressed to supervision for safety.    Stairs Stairs: Yes   Stair Management: One rail Left;Step to pattern;Forwards (HHA on R) Number of Stairs: 12 (4 stairs x3 trials) General stair comments: Pt first practiced with therapist, then with daughter, then with husband. Min guard provided each trial and no LOB noted.   Wheelchair Mobility    Modified Rankin (Stroke Patients Only)       Balance Overall balance assessment: Needs assistance Sitting-balance support: No upper extremity supported;Feet supported Sitting balance-Leahy Scale: Fair     Standing balance support: Bilateral upper extremity supported;No upper extremity supported Standing balance-Leahy Scale: Fair                              Cognition Arousal/Alertness: Awake/alert Behavior During Therapy: WFL for tasks assessed/performed Overall Cognitive Status: Within Functional Limits for tasks assessed                                        Exercises      General Comments  Pertinent Vitals/Pain Pain Assessment: Faces Faces Pain Scale: Hurts little more Pain Location: low back Pain Descriptors / Indicators: Aching;Grimacing;Guarding Pain Intervention(s): Limited activity within patient's tolerance;Monitored during session;Repositioned    Home Living                       Prior Function            PT Goals (current goals can now be found in the care plan section) Acute Rehab PT Goals Patient Stated Goal: to get home and have less pain PT Goal Formulation: With patient/family Time For Goal Achievement: 09/18/16 Potential to Achieve Goals: Good Progress towards PT goals: Progressing toward goals    Frequency    Min 5X/week      PT Plan Current plan remains appropriate    Co-evaluation              AM-PAC PT "6 Clicks" Daily Activity  Outcome Measure  Difficulty turning over in bed (including adjusting bedclothes, sheets and blankets)?: None Difficulty moving from lying on back to sitting on the side of the bed? : A Little Difficulty sitting down on and standing up from a chair with arms (e.g., wheelchair, bedside commode, etc,.)?: Total Help needed moving to and from a bed to chair (including a wheelchair)?: A Little Help needed walking in hospital room?: A Little Help needed climbing 3-5 steps with a railing? : A Lot 6 Click Score: 16    End of Session Equipment Utilized During Treatment: Gait belt;Back brace Activity Tolerance: Patient limited by lethargy Patient left: in bed;with call bell/phone within reach;with family/visitor present Nurse Communication: Mobility status PT Visit Diagnosis: Difficulty in walking, not elsewhere classified (R26.2);Pain Pain - part of body:  (back)     Time: 3007-6226 PT Time Calculation (min) (ACUTE ONLY): 23 min  Charges:  $Gait Training: 23-37 mins                    G Codes:       Rolinda Roan, PT, DPT Acute Rehabilitation Services Pager: [phone]    Thelma Comp 09/12/2016, 10:37 AM

## 2016-09-12 NOTE — Discharge Instructions (Signed)
Lumbar Diskectomy, Care After °Refer to this sheet in the next few weeks. These instructions provide you with information about caring for yourself after your procedure. Your health care provider may also give you more specific instructions. Your treatment has been planned according to current medical practices, but problems sometimes occur. Call your health care provider if you have any problems or questions after your procedure. °What can I expect after the procedure? °After the procedure, it is common to have: °· Pain. °· Numbness. °· Weakness. ° °Follow these instructions at home: °Medicines °· Take medicines only as directed by your health care provider. °· If you were prescribed an antibiotic medicine, finish all of it even if you start to feel better. °Incision care °· There are many different ways to close and cover an incision, including stitches (sutures), skin glue, and adhesive strips. Follow your health care provider's instructions about: °? Incision care. °? Bandage (dressing) changes and removal. °? Incision closure removal. °· Check your incision area every day for signs of infection. If you cannot see your incision, have someone check it for you. Watch for: °? Redness, swelling, or pain. °? Fluid, blood, or pus. °Activity °· Avoid sitting for longer than 20 minutes at a time or as directed by your health care provider. °· Do not climb stairs more than once each day until your health care provider approves. °· Do not bend at your waist. To pick things up, bend your knees. °· Do not lift anything that is heavier than 10 lb (4.5 kg) or as directed by your health care provider. °· Do not drive a car until your health care provider approves. °· Ask your health care provider when you may return to your normal activities, such as playing sports and going back to work. °· Work with your physical therapist to learn safe movement and exercises to help healing. Do these exercises as directed. °· Take short  walks often. °General instructions °· Do not use any tobacco products, including cigarettes, chewing tobacco, or electronic cigarettes. If you need help quitting, ask your health care provider. °· Follow your health care provider’s instructions about bathing. Do not take baths, shower, swim, or use a hot tub until your health care provider approves. °· Wear your back brace as directed by your health care provider. °· To prevent constipation: °? Drink enough fluid to keep your urine clear or pale yellow. °? Eat plenty of fruits, vegetables, and whole grains. °· Keep all follow-up visits as directed by your health care provider. This is important. This includes any follow-up visits with your physical therapist. °Contact a health care provider if: °· You have a fever. °· You have redness, swelling, or pain in your incision area. °· Your pain is not controlled with medicine. °· You have pain, numbness, or weakness that lasts longer than three weeks after surgery. °· You become constipated. °Get help right away if: °· You have fluid, blood, or pus coming from your incision. °· You have increasing pain, numbness, or weakness. °· You lose control of when you urinate or have a bowel movement (incontinence). °· You have chest pain. °· You have trouble breathing. °This information is not intended to replace advice given to you by your health care provider. Make sure you discuss any questions you have with your health care provider. °Document Released: 01/23/2004 Document Revised: 07/26/2015 Document Reviewed: 10/12/2013 °Elsevier Interactive Patient Education © 2018 [employer] ° °

## 2016-09-12 NOTE — Progress Notes (Signed)
    Subjective: Procedure(s) (LRB): Lumbar three-five decompression, insitu fusion Lumbar fhree-five,  removal of lumbar hardware Lumbar five-Sacral one (N/A) 1 Day Post-Op  Patient reports pain as 4 on 0-10 scale.  Reports decreased leg pain reports incisional back pain   Negative void Negative bowel movement Positive flatus Negative chest pain or shortness of breath  Objective: Vital signs in last 24 hours: Temp:  [97.2 F (36.2 C)-98.4 F (36.9 C)] 98.1 F (36.7 C) (07/13 0400) Pulse Rate:  [59-90] 85 (07/13 0400) Resp:  [8-20] 18 (07/13 0400) BP: (90-136)/(39-73) 121/53 (07/13 0400) SpO2:  [94 %-100 %] 94 % (07/13 0400)  Intake/Output from previous day: 07/12 0701 - 07/13 0700 In: 2490 [P.O.:240; I.V.:2000; IV Piggyback:250] Out: 1930 [FGBMS:1115; Drains:140; Blood:500]  Labs: No results for input(s): WBC, RBC, HCT, PLT in the last 72 hours. No results for input(s): NA, K, CL, CO2, BUN, CREATININE, GLUCOSE, CALCIUM in the last 72 hours. No results for input(s): LABPT, INR in the last 72 hours.  Physical Exam: Neurologically intact ABD soft Intact pulses distally Incision: dressing C/D/I Compartment soft  Assessment/Plan: Patient stable  xrays pending Continue mobilization with physical therapy Continue care  Advance diet Up with therapy Plan for discharge tomorrow  Ambulated 3-4 times yesterday - doing well Patient with IBS and will monitor constipation PT to see patient today Drain to be removed later today if output remains low  Melina Schools, [place] [phone]

## 2016-09-12 NOTE — Progress Notes (Signed)
    Subjective: 1 Day Post-Op Procedure(s) (LRB): Lumbar three-five decompression, insitu fusion Lumbar fhree-five,  removal of lumbar hardware Lumbar five-Sacral one (N/A) Patient reports pain as 7 on 0-10 scale.   Denies CP or SOB.  Voiding without difficulty. Positive flatus.  Pt denies leg pain.  Reports incisional pain Objective: Vital signs in last 24 hours: Temp:  [98.1 F (36.7 C)-99.3 F (37.4 C)] 98.9 F (37.2 C) (07/13 1247) Pulse Rate:  [72-90] 90 (07/13 1247) Resp:  [18-20] 18 (07/13 1247) BP: (101-136)/(49-73) 101/53 (07/13 1247) SpO2:  [94 %-100 %] 94 % (07/13 1247)  Intake/Output from previous day: 07/12 0701 - 07/13 0700 In: 2490 [P.O.:240; I.V.:2000; IV Piggyback:250] Out: 1930 [PHKFE:7614; Drains:140; Blood:500] Intake/Output this shift: Total I/O In: 360 [P.O.:360] Out: 500 [Urine:500]  Labs: No results for input(s): HGB in the last 72 hours. No results for input(s): WBC, RBC, HCT, PLT in the last 72 hours. No results for input(s): NA, K, CL, CO2, BUN, CREATININE, GLUCOSE, CALCIUM in the last 72 hours. No results for input(s): LABPT, INR in the last 72 hours.  Physical Exam: Neurologically intact ABD soft Sensation intact distally Dorsiflexion/Plantar flexion intact Incision: moderate drainage Compartment soft  Assessment/Plan: 1 Day Post-Op Procedure(s) (LRB): Lumbar three-five decompression, insitu fusion Lumbar fhree-five,  removal of lumbar hardware Lumbar five-Sacral one (N/A) Advance diet Up with therapy  30 cc blood from drain, removed drain Changed dressings Pt may DC tomorrow after cleared by Rounding Physician and cleared by PT Advised caution in restarting Ambien while she is on the post op dose of Oxycodone - pt voiced understanding   [place], Darla Lesches for Dr. Melina Schools [facility] [phone] 09/12/2016, 3:30 PM

## 2016-09-13 NOTE — Care Management Note (Signed)
Case Management Note  Patient Details  Name: Shelia [name] MRN: [id number] Date of Birth: April 13, 1945  Subjective/Objective:                 Patient with order to DC to home. Chart reviewed, no rec for [facility] or DME per PT eval. No other CM needs identified.     Action/Plan:  DC to home Expected Discharge Date:  09/13/16               Expected Discharge Plan:  Home/Self Care  In-House Referral:     Discharge planning Services  CM Consult  Post Acute Care Choice:    Choice offered to:     DME Arranged:    DME Agency:     HH Arranged:    HH Agency:     Status of Service:  Completed, signed off  If discussed at [employer] of Stay Meetings, dates discussed:    Additional Comments:  Carles Collet, RN 09/13/2016, 8:09 AM

## 2016-09-13 NOTE — Progress Notes (Signed)
Physical Therapy Treatment Patient Details Name: Shelia [name] MRN: [id number] DOB: [birth year] Today's Date: 09/13/2016    History of Present Illness Pt is a 71 yo female admitted on 09/11/16 for L3-L5 decompression & fusion with removeal of hardware at L5-S1. PMH significant for lumbosacral DDD, sciatica R, spinal stenosis, spondylolithesis lumbar, cervicalgia, CTS, HTN, Depression, GERD, HLD, IBS, Spinal surgery 2012.     PT Comments    Patient progressing well with mobility. Educated on car transfers, mobility expectations and safety. Anticipate will be safe for d/c home.   Follow Up Recommendations  No PT follow up     Equipment Recommendations  None recommended by PT    Recommendations for Other Services       Precautions / Restrictions Precautions Precautions: Back;Fall Precaution Booklet Issued: Yes (comment) Precaution Comments: Pt able to recall 2.3 precautions. Handout left  Required Braces or Orthoses: Spinal Brace Spinal Brace: Lumbar corset;Applied in sitting position Restrictions Weight Bearing Restrictions: No    Mobility  Bed Mobility Overal bed mobility: Needs Assistance Bed Mobility: Rolling;Sit to Sidelying Rolling: Modified independent (Device/Increase time) Sidelying to sit: Supervision       General bed mobility comments: Increased time to perform, no physical assist or cues required  Transfers Overall transfer level: Needs assistance Equipment used: Rolling walker (2 wheeled) Transfers: Sit to/from Stand Sit to Stand: Supervision         General transfer comment: supervision for safety  Ambulation/Gait Ambulation/Gait assistance: Supervision Ambulation Distance (Feet): 410 Feet Assistive device: Rolling walker (2 wheeled) Gait Pattern/deviations: Step-through pattern;Decreased step length - right;Decreased step length - left Gait velocity: Decreased   General Gait Details: VCs for increased cadence   Stairs             Wheelchair Mobility    Modified Rankin (Stroke Patients Only)       Balance Overall balance assessment: Needs assistance Sitting-balance support: No upper extremity supported;Feet supported Sitting balance-Leahy Scale: Fair     Standing balance support: Bilateral upper extremity supported;No upper extremity supported Standing balance-Leahy Scale: Fair                              Cognition Arousal/Alertness: Awake/alert Behavior During Therapy: WFL for tasks assessed/performed Overall Cognitive Status: Within Functional Limits for tasks assessed                                        Exercises      General Comments General comments (skin integrity, edema, etc.): educated on car transfers, mobility expectations, safety, and pain management strategies.      Pertinent Vitals/Pain Pain Assessment: 0-10 Faces Pain Scale: Hurts little more Pain Location: low back and reflux Pain Descriptors / Indicators: Aching;Grimacing;Guarding Pain Intervention(s): Monitored during session    Home Living                      Prior Function            PT Goals (current goals can now be found in the care plan section) Acute Rehab PT Goals Patient Stated Goal: to get home and have less pain PT Goal Formulation: With patient/family Time For Goal Achievement: 09/18/16 Potential to Achieve Goals: Good Progress towards PT goals: Progressing toward goals    Frequency    Min 5X/week  PT Plan Current plan remains appropriate    Co-evaluation              AM-PAC PT "6 Clicks" Daily Activity  Outcome Measure  Difficulty turning over in bed (including adjusting bedclothes, sheets and blankets)?: None Difficulty moving from lying on back to sitting on the side of the bed? : A Little Difficulty sitting down on and standing up from a chair with arms (e.g., wheelchair, bedside commode, etc,.)?: Total Help needed moving to and from a  bed to chair (including a wheelchair)?: A Little Help needed walking in hospital room?: A Little Help needed climbing 3-5 steps with a railing? : A Lot 6 Click Score: 16    End of Session Equipment Utilized During Treatment: Gait belt;Back brace Activity Tolerance: Patient limited by lethargy Patient left: in chair;with call bell/phone within reach;with family/visitor present Nurse Communication: Mobility status PT Visit Diagnosis: Difficulty in walking, not elsewhere classified (R26.2);Pain Pain - part of body:  (back)     Time: 0705-0726 PT Time Calculation (min) (ACUTE ONLY): 21 min  Charges:  $Gait Training: 8-22 mins                    G Codes:       Alben Deeds, PT DPT [facility] [phone]    Duncan Dull 09/13/2016, 8:05 AM

## 2016-09-13 NOTE — Progress Notes (Signed)
Subjective: 2 Days Post-Op Procedure(s) (LRB): Lumbar three-five decompression, insitu fusion Lumbar fhree-five,  removal of lumbar hardware Lumbar five-Sacral one (N/A) Patient reports pain as mild.    Objective: Vital signs in last 24 hours: Temp:  [98.9 F (37.2 C)-100 F (37.8 C)] 99.5 F (37.5 C) (07/14 0825) Pulse Rate:  [81-91] 83 (07/14 0825) Resp:  [15-18] 18 (07/14 0825) BP: (91-104)/(49-53) 95/49 (07/14 0825) SpO2:  [94 %-100 %] 96 % (07/14 0825)  Intake/Output from previous day: 07/13 0701 - 07/14 0700 In: 720 [P.O.:720] Out: 530 [Urine:500; Drains:30] Intake/Output this shift: No intake/output data recorded.  No results for input(s): HGB in the last 72 hours. No results for input(s): WBC, RBC, HCT, PLT in the last 72 hours. No results for input(s): NA, K, CL, CO2, BUN, CREATININE, GLUCOSE, CALCIUM in the last 72 hours. No results for input(s): LABPT, INR in the last 72 hours.  Neurologically intact Neurovascular intact  Assessment/Plan: 2 Days Post-Op Procedure(s) (LRB): Lumbar three-five decompression, insitu fusion Lumbar fhree-five,  removal of lumbar hardware Lumbar five-Sacral one (N/A) Discharge home with home health  [place] V 09/13/2016, 8:50 AM

## 2016-09-13 NOTE — Progress Notes (Signed)
Patient alert and oriented, mae's well, voiding adequate amount of urine, swallowing without difficulty, c/o moderate pain and meds given prior to discharged for ride and discomfort. Patient discharged home with family. Script and discharged instructions given to patient. Patient and family stated understanding of instructions given. Patient has an appointment with Dr. Rolena Infante in 2 weeks.

## 2016-09-13 NOTE — Progress Notes (Signed)
Occupational Therapy Treatment Patient Details Name: Shelia [name] MRN: [id number] DOB: September 06, 1945 Today's Date: 09/13/2016    History of present illness Pt is a 71 yo female admitted on 09/11/16 for L3-L5 decompression & fusion with removeal of hardware at L5-S1. PMH significant for lumbosacral DDD, sciatica R, spinal stenosis, spondylolithesis lumbar, cervicalgia, CTS, HTN, Depression, GERD, HLD, IBS, Spinal surgery 2012.    OT comments  Pt. Making gains with skilled OT. Pt. And spouse report pt. Is moving better than previous sessions.  Pt. Able to complete bed mobility and toileting tasks at S level of A.  Will have friends checking in on her during the day and husband reports he will help in the morning and evenings.  D/c home likely later today.    Follow Up Recommendations  No OT follow up;Supervision - Intermittent    Equipment Recommendations  None recommended by OT    Recommendations for Other Services      Precautions / Restrictions Precautions Precautions: Back;Fall Precaution Booklet Issued: Yes (comment) Precaution Comments: pt. and spouse able to recall 3/3 precautions Required Braces or Orthoses: Spinal Brace Spinal Brace: Lumbar corset;Applied in sitting position;Other (comment) Spinal Brace Comments: pt. amb. to b.room upon my arrival without brace. states she was told she can go to/from b.room without brace Restrictions Weight Bearing Restrictions: No       Mobility Bed Mobility Overal bed mobility: Modified Independent Bed Mobility: Sit to Sidelying;Rolling Rolling: Supervision Sidelying to sit: Supervision     Sit to sidelying: Supervision General bed mobility comments: no physical assist  Transfers Overall transfer level: Needs assistance Equipment used: Rolling walker (2 wheeled) Transfers: Sit to/from Stand Sit to Stand: Supervision         General transfer comment: supervision for safety    Balance Overall balance assessment: Needs  assistance Sitting-balance support: No upper extremity supported;Feet supported Sitting balance-Leahy Scale: Fair     Standing balance support: Bilateral upper extremity supported;No upper extremity supported Standing balance-Leahy Scale: Fair                             ADL either performed or assessed with clinical judgement   ADL Overall ADL's : Needs assistance/impaired     Grooming: Wash/dry hands;Standing;Supervision/safety         Lower Body Bathing Details (indicate cue type and reason): pt. and spouse report bathing will be completed when he is home so he can assist as needed Upper Body Dressing : Set up;Sitting Upper Body Dressing Details (indicate cue type and reason): verbalizes techniques for donning shirt and brace-declined need for review   Lower Body Dressing Details (indicate cue type and reason): pt. reports she used reacher for donning pajamas this am and had no difficulty Toilet Transfer: Supervision/safety;RW;Comfort height toilet;BSC Toilet Transfer Details (indicate cue type and reason): pt. used 3n1 over the commode and reports having the same set up at home [place] and Hygiene: Supervision/safety;Sit to/from Nurse, children's Details (indicate cue type and reason): pt. describes having a shower chair and ablet to side step over the tub., declined need for review Functional mobility during ADLs: Supervision/safety;Rolling walker General ADL Comments: pt.s spouse states he had "concerns" yesterday but feels better with how she is moving today.  pt. will have a friend stopping by everyday for lunch and then spouse will be home around dinner     Vision       Perception  Praxis      Cognition Arousal/Alertness: Awake/alert Behavior During Therapy: WFL for tasks assessed/performed Overall Cognitive Status: Within Functional Limits for tasks assessed                                           Exercises     Shoulder Instructions       General Comments educated on car transfers, mobility expectations, safety, and pain management strategies.    Pertinent Vitals/ Pain       Pain Assessment: No/denies pain Faces Pain Scale: Hurts little more Pain Location: low back and reflux Pain Descriptors / Indicators: Aching;Grimacing;Guarding Pain Intervention(s): Monitored during session  Home Living                                          Prior Functioning/Environment              Frequency  Min 3X/week        Progress Toward Goals  OT Goals(current goals can now be found in the care plan section)  Progress towards OT goals: Progressing toward goals  Acute Rehab OT Goals Patient Stated Goal: to get home and have less pain  Plan Discharge plan remains appropriate    Co-evaluation                 AM-PAC PT "6 Clicks" Daily Activity     Outcome Measure   Help from another person eating meals?: None Help from another person taking care of personal grooming?: A Little Help from another person toileting, which includes using toliet, bedpan, or urinal?: A Little Help from another person bathing (including washing, rinsing, drying)?: A Little   Help from another person to put on and taking off regular lower body clothing?: A Lot 6 Click Score: 15    End of Session Equipment Utilized During Treatment: Rolling walker  OT Visit Diagnosis: Unsteadiness on feet (R26.81);Muscle weakness (generalized) (M62.81);Pain   Activity Tolerance Patient tolerated treatment well   Patient Left in bed;with call bell/phone within reach;with family/visitor present   Nurse Communication          Time: 2025-4270 OT Time Calculation (min): 16 min  Charges: OT General Charges $OT Visit: 1 Procedure OT Treatments $Self Care/Home Management : 8-22 mins   Janice Coffin, COTA/L 09/13/2016, 8:44 AM

## 2016-09-16 ENCOUNTER — Encounter (HOSPITAL_COMMUNITY): Payer: Self-pay | Admitting: Orthopedic Surgery

## 2016-09-16 ENCOUNTER — Encounter: Payer: Self-pay | Admitting: Nurse Practitioner

## 2016-09-23 NOTE — Discharge Summary (Signed)
Physician Discharge Summary  Patient ID: Shelia [name] MRN: [id number] DOB/AGE: 1945-09-23 71 y.o.  Admit date: 09/11/2016 Discharge date: 09/13/16  Admission Diagnoses:  Lumbar Spinal Stenosis  Discharge Diagnoses:  Active Problems:   Status post lumbar surgery   Back pain   Past Medical History:  Diagnosis Date  . Cystitis   . Depression   . DJD (degenerative joint disease)   . GERD (gastroesophageal reflux disease)   . Hyperlipidemia   . Hypertension   . Irritable bowel syndrome (IBS)   . PONV (postoperative nausea and vomiting)    with gallbladder surgery 2014  . Spinal stenosis     Surgeries: Procedure(s): Lumbar three-five decompression, insitu fusion Lumbar fhree-five,  removal of lumbar hardware Lumbar five-Sacral one on 09/11/2016   Consultants (if any):   Discharged Condition: Improved  Hospital Course: Shelia [name] is an 71 y.o. female who was admitted 09/11/2016 with a diagnosis of Lumbar spinal stenosis and went to the operating room on 09/11/2016 and underwent the above named procedures.  Post op day 2 pt reports low level of pain controlled  on oral medication. The pt is ambulating in the hallway. The pt is voiding w/o difficulty. The pts drain was removed post op day 1.   She was given perioperative antibiotics:  Anti-infectives    Start     Dose/Rate Route Frequency Ordered Stop   09/11/16 1600  ceFAZolin (ANCEF) IVPB 2g/100 mL premix     2 g 200 mL/hr over 30 Minutes Intravenous Every 8 hours 09/11/16 1433 09/11/16 2328   09/11/16 0618  ceFAZolin (ANCEF) IVPB 2g/100 mL premix     2 g 200 mL/hr over 30 Minutes Intravenous 30 min pre-op 09/11/16 0618 09/11/16 0755    .  She was given sequential compression devices, early ambulation, and TED for DVT prophylaxis.  She benefited maximally from the hospital stay and there were no complications.    Recent vital signs:  Vitals:   09/13/16 0500 09/13/16 0825  BP: (!) 104/52 (!) 95/49  Pulse: 91  83  Resp: 18 18  Temp: 100 F (37.8 C) 99.5 F (37.5 C)    Recent laboratory studies:  Lab Results  Component Value Date   HGB 13.7 09/05/2016   HGB 13.3 08/17/2012   HGB 12.8 08/02/2010   Lab Results  Component Value Date   WBC 6.9 09/05/2016   PLT 289 09/05/2016   No results found for: INR Lab Results  Component Value Date   NA 139 09/05/2016   K 3.3 (L) 09/05/2016   CL 100 (L) 09/05/2016   CO2 29 09/05/2016   BUN 16 09/05/2016   CREATININE 1.12 (H) 09/05/2016   GLUCOSE 97 09/05/2016    Discharge Medications:   Allergies as of 09/13/2016      Reactions   No Known Allergies       Medication List    TAKE these medications   aspirin 81 MG tablet Take 81 mg by mouth daily.   celecoxib 200 MG capsule Commonly known as:  CELEBREX Take 200 mg by mouth daily.   cholecalciferol 1000 units tablet Commonly known as:  VITAMIN D Take 1,000 Units by mouth daily.   clobetasol ointment 0.05 % Commonly known as:  TEMOVATE Apply 1 application topically 2 (two) times daily.   ESTROVEN MENOPAUSE RELIEF PO Take 1 capsule by mouth daily.   fish oil-omega-3 fatty acids 1000 MG capsule Take 2 g by mouth daily.   fluticasone 50 MCG/ACT nasal spray  Commonly known as:  FLONASE Place 2 sprays into both nostrils daily as needed for allergies or rhinitis.   gabapentin 300 MG capsule Commonly known as:  NEURONTIN Take 600 mg by mouth at bedtime.   losartan-hydrochlorothiazide 100-25 MG tablet Commonly known as:  HYZAAR Take 1 tablet by mouth daily.   methocarbamol 500 MG tablet Commonly known as:  ROBAXIN Take 1 tablet (500 mg total) by mouth 3 (three) times daily.   MULTIVITAMIN PO Take 1 tablet by mouth daily.   ondansetron 4 MG tablet Commonly known as:  ZOFRAN Take 1 tablet (4 mg total) by mouth every 6 (six) hours as needed for nausea or vomiting.   Oxycodone HCl 10 MG Tabs Take 1 tablet (10 mg total) by mouth every 4 (four) hours as needed for severe  pain.   potassium chloride SA 20 MEQ tablet Commonly known as:  K-DUR,KLOR-CON Take 20 mEq by mouth 2 (two) times daily.   triamterene-hydrochlorothiazide 37.5-25 MG tablet Commonly known as:  MAXZIDE-25 Take 0.5 tablets by mouth 2 (two) times daily.   zolpidem 10 MG tablet Commonly known as:  AMBIEN Take 10 mg by mouth at bedtime.       Diagnostic Studies: Dg Lumbar Spine 2-3 Views  Result Date: 09/12/2016 CLINICAL DATA:  Status post lumbar surgery. EXAM: LUMBAR SPINE - 2-3 VIEW COMPARISON:  Radiographs of September 11, 2016. FINDINGS: Mild levoscoliosis of lumbar spine is noted. Diffuse osteopenia is noted. Minimal grade 1 anterolisthesis of L4-5 is noted secondary to posterior facet joint hypertrophy. Mild degenerative disc disease is noted at L3-4 and L5-S1. Soft tissue gas is seen posterior to the lumbar spine consistent with recent surgery. IMPRESSION: Expected postoperative changes is seen in the soft tissues posterior to lumbar spine. Mild multilevel degenerative disc disease. No acute abnormality seen in the lumbar spine. Electronically Signed   By: Marijo Conception, M.D.   On: 09/12/2016 12:31   Dg Lumbar Spine 1 View  Result Date: 09/11/2016 CLINICAL DATA:  Intraoperative localization prior to L3 through L5 decompression and fusion. EXAM: Operative LUMBAR SPINE - 1 VIEW COMPARISON:  Lumbar spine CT 08/12/2016. FINDINGS: The localization devices are posterior to the L3 vertebral body and posterior to the upper endplate of L5. Pedicle screws remain at the L5 level. IMPRESSION: Intraoperative localization of L3 and L5. Electronically Signed   By: Evangeline Dakin M.D.   On: 09/11/2016 10:31    Disposition: 01-Home or Self Care Pt will present to clinic in 2 weeks Post op medication provided  Discharge Instructions    Incentive spirometry RT    Complete by:  As directed       Follow-up Information    Shelia Schools, MD. Schedule an appointment as soon as possible for a visit in  2 week(s).   Specialty:  Orthopedic Surgery Why:  Call office ASAP to setup appoinment with Dr. Rolena Infante for follow up visit Contact information: [address] [place] [address] [place] [address] [phone]            Signed: Valinda Hoar 09/23/2016, 1:11 PM

## 2016-09-29 DIAGNOSIS — Z4789 Encounter for other orthopedic aftercare: Secondary | ICD-10-CM | POA: Diagnosis not present

## 2016-09-29 DIAGNOSIS — Z9889 Other specified postprocedural states: Secondary | ICD-10-CM | POA: Diagnosis not present

## 2016-10-20 DIAGNOSIS — Z4789 Encounter for other orthopedic aftercare: Secondary | ICD-10-CM | POA: Diagnosis not present

## 2016-10-23 DIAGNOSIS — M4316 Spondylolisthesis, lumbar region: Secondary | ICD-10-CM | POA: Diagnosis not present

## 2016-10-27 DIAGNOSIS — M4316 Spondylolisthesis, lumbar region: Secondary | ICD-10-CM | POA: Diagnosis not present

## 2016-10-30 DIAGNOSIS — M4316 Spondylolisthesis, lumbar region: Secondary | ICD-10-CM | POA: Diagnosis not present

## 2016-11-04 DIAGNOSIS — M4316 Spondylolisthesis, lumbar region: Secondary | ICD-10-CM | POA: Diagnosis not present

## 2016-11-06 DIAGNOSIS — M4316 Spondylolisthesis, lumbar region: Secondary | ICD-10-CM | POA: Diagnosis not present

## 2016-11-10 DIAGNOSIS — M4316 Spondylolisthesis, lumbar region: Secondary | ICD-10-CM | POA: Diagnosis not present

## 2016-11-13 DIAGNOSIS — M4316 Spondylolisthesis, lumbar region: Secondary | ICD-10-CM | POA: Diagnosis not present

## 2016-11-20 DIAGNOSIS — M4316 Spondylolisthesis, lumbar region: Secondary | ICD-10-CM | POA: Diagnosis not present

## 2016-12-01 DIAGNOSIS — Z4789 Encounter for other orthopedic aftercare: Secondary | ICD-10-CM | POA: Diagnosis not present

## 2016-12-06 DIAGNOSIS — Z23 Encounter for immunization: Secondary | ICD-10-CM | POA: Diagnosis not present

## 2016-12-08 DIAGNOSIS — M4316 Spondylolisthesis, lumbar region: Secondary | ICD-10-CM | POA: Diagnosis not present

## 2016-12-11 DIAGNOSIS — M4316 Spondylolisthesis, lumbar region: Secondary | ICD-10-CM | POA: Diagnosis not present

## 2016-12-12 DIAGNOSIS — M533 Sacrococcygeal disorders, not elsewhere classified: Secondary | ICD-10-CM | POA: Diagnosis not present

## 2016-12-12 DIAGNOSIS — M461 Sacroiliitis, not elsewhere classified: Secondary | ICD-10-CM | POA: Diagnosis not present

## 2016-12-18 DIAGNOSIS — M4316 Spondylolisthesis, lumbar region: Secondary | ICD-10-CM | POA: Diagnosis not present

## 2016-12-23 DIAGNOSIS — M4316 Spondylolisthesis, lumbar region: Secondary | ICD-10-CM | POA: Diagnosis not present

## 2016-12-25 DIAGNOSIS — M4316 Spondylolisthesis, lumbar region: Secondary | ICD-10-CM | POA: Diagnosis not present

## 2016-12-26 DIAGNOSIS — M4316 Spondylolisthesis, lumbar region: Secondary | ICD-10-CM | POA: Diagnosis not present

## 2016-12-26 DIAGNOSIS — Z4789 Encounter for other orthopedic aftercare: Secondary | ICD-10-CM | POA: Diagnosis not present

## 2016-12-29 DIAGNOSIS — M4316 Spondylolisthesis, lumbar region: Secondary | ICD-10-CM | POA: Diagnosis not present

## 2017-01-01 DIAGNOSIS — M4316 Spondylolisthesis, lumbar region: Secondary | ICD-10-CM | POA: Diagnosis not present

## 2017-01-06 DIAGNOSIS — M4316 Spondylolisthesis, lumbar region: Secondary | ICD-10-CM | POA: Diagnosis not present

## 2017-01-13 DIAGNOSIS — M4316 Spondylolisthesis, lumbar region: Secondary | ICD-10-CM | POA: Diagnosis not present

## 2017-01-16 DIAGNOSIS — M961 Postlaminectomy syndrome, not elsewhere classified: Secondary | ICD-10-CM | POA: Diagnosis not present

## 2017-01-16 DIAGNOSIS — M5137 Other intervertebral disc degeneration, lumbosacral region: Secondary | ICD-10-CM | POA: Diagnosis not present

## 2017-01-16 DIAGNOSIS — M545 Low back pain: Secondary | ICD-10-CM | POA: Diagnosis not present

## 2017-01-16 DIAGNOSIS — M5127 Other intervertebral disc displacement, lumbosacral region: Secondary | ICD-10-CM | POA: Diagnosis not present

## 2017-01-20 DIAGNOSIS — M4316 Spondylolisthesis, lumbar region: Secondary | ICD-10-CM | POA: Diagnosis not present

## 2017-01-27 DIAGNOSIS — M4316 Spondylolisthesis, lumbar region: Secondary | ICD-10-CM | POA: Diagnosis not present

## 2017-01-29 DIAGNOSIS — M4316 Spondylolisthesis, lumbar region: Secondary | ICD-10-CM | POA: Diagnosis not present

## 2017-01-30 DIAGNOSIS — Z4789 Encounter for other orthopedic aftercare: Secondary | ICD-10-CM | POA: Diagnosis not present

## 2017-02-13 DIAGNOSIS — M961 Postlaminectomy syndrome, not elsewhere classified: Secondary | ICD-10-CM | POA: Diagnosis not present

## 2017-02-13 DIAGNOSIS — M5137 Other intervertebral disc degeneration, lumbosacral region: Secondary | ICD-10-CM | POA: Diagnosis not present

## 2017-02-13 DIAGNOSIS — M5416 Radiculopathy, lumbar region: Secondary | ICD-10-CM | POA: Diagnosis not present

## 2017-02-15 ENCOUNTER — Observation Stay (HOSPITAL_COMMUNITY)
Admission: EM | Admit: 2017-02-15 | Discharge: 2017-02-16 | Disposition: A | Discharge status: Home / Self Care | Payer: Medicare Other | Attending: Internal Medicine | Admitting: Internal Medicine

## 2017-02-15 ENCOUNTER — Emergency Department (HOSPITAL_COMMUNITY): Payer: Medicare Other

## 2017-02-15 ENCOUNTER — Other Ambulatory Visit: Payer: Self-pay

## 2017-02-15 ENCOUNTER — Encounter (HOSPITAL_COMMUNITY): Payer: Self-pay | Admitting: *Deleted

## 2017-02-15 DIAGNOSIS — R0602 Shortness of breath: Secondary | ICD-10-CM | POA: Diagnosis not present

## 2017-02-15 DIAGNOSIS — M545 Low back pain, unspecified: Secondary | ICD-10-CM | POA: Diagnosis present

## 2017-02-15 DIAGNOSIS — R5383 Other fatigue: Secondary | ICD-10-CM | POA: Insufficient documentation

## 2017-02-15 DIAGNOSIS — E876 Hypokalemia: Secondary | ICD-10-CM | POA: Diagnosis not present

## 2017-02-15 DIAGNOSIS — G8929 Other chronic pain: Secondary | ICD-10-CM | POA: Diagnosis not present

## 2017-02-15 DIAGNOSIS — R51 Headache: Secondary | ICD-10-CM | POA: Insufficient documentation

## 2017-02-15 DIAGNOSIS — R0600 Dyspnea, unspecified: Secondary | ICD-10-CM | POA: Insufficient documentation

## 2017-02-15 DIAGNOSIS — R479 Unspecified speech disturbances: Secondary | ICD-10-CM | POA: Diagnosis not present

## 2017-02-15 DIAGNOSIS — I10 Essential (primary) hypertension: Secondary | ICD-10-CM | POA: Insufficient documentation

## 2017-02-15 DIAGNOSIS — R42 Dizziness and giddiness: Secondary | ICD-10-CM | POA: Diagnosis not present

## 2017-02-15 DIAGNOSIS — R000 Tachycardia, unspecified: Secondary | ICD-10-CM | POA: Diagnosis not present

## 2017-02-15 DIAGNOSIS — R55 Syncope and collapse: Secondary | ICD-10-CM | POA: Diagnosis not present

## 2017-02-15 DIAGNOSIS — R531 Weakness: Secondary | ICD-10-CM | POA: Diagnosis not present

## 2017-02-15 DIAGNOSIS — Z79899 Other long term (current) drug therapy: Secondary | ICD-10-CM | POA: Insufficient documentation

## 2017-02-15 DIAGNOSIS — Z7982 Long term (current) use of aspirin: Secondary | ICD-10-CM | POA: Diagnosis not present

## 2017-02-15 LAB — URINALYSIS, ROUTINE W REFLEX MICROSCOPIC
BACTERIA UA: NONE SEEN
BILIRUBIN URINE: NEGATIVE
Glucose, UA: NEGATIVE mg/dL
KETONES UR: NEGATIVE mg/dL
Nitrite: NEGATIVE
Protein, ur: NEGATIVE mg/dL
SPECIFIC GRAVITY, URINE: 1.013 (ref 1.005–1.030)
pH: 7 (ref 5.0–8.0)

## 2017-02-15 LAB — CBC
HEMATOCRIT: 37.8 % (ref 36.0–46.0)
Hemoglobin: 12.2 g/dL (ref 12.0–15.0)
MCH: 28.6 pg (ref 26.0–34.0)
MCHC: 32.3 g/dL (ref 30.0–36.0)
MCV: 88.7 fL (ref 78.0–100.0)
PLATELETS: 317 10*3/uL (ref 150–400)
RBC: 4.26 MIL/uL (ref 3.87–5.11)
RDW: 14.1 % (ref 11.5–15.5)
WBC: 9.3 10*3/uL (ref 4.0–10.5)

## 2017-02-15 LAB — BASIC METABOLIC PANEL
Anion gap: 13 (ref 5–15)
BUN: 20 mg/dL (ref 6–20)
CHLORIDE: 101 mmol/L (ref 101–111)
CO2: 23 mmol/L (ref 22–32)
CREATININE: 1.19 mg/dL — AB (ref 0.44–1.00)
Calcium: 9.6 mg/dL (ref 8.9–10.3)
GFR calc Af Amer: 52 mL/min — ABNORMAL LOW (ref >60)
GFR calc non Af Amer: 45 mL/min — ABNORMAL LOW (ref >60)
GLUCOSE: 112 mg/dL — AB (ref 65–99)
POTASSIUM: 3 mmol/L — AB (ref 3.5–5.1)
SODIUM: 137 mmol/L (ref 135–145)

## 2017-02-15 LAB — TROPONIN I

## 2017-02-15 LAB — CBG MONITORING, ED: GLUCOSE-CAPILLARY: 74 mg/dL (ref 65–99)

## 2017-02-15 LAB — D-DIMER, QUANTITATIVE (NOT AT ARMC): D DIMER QUANT: 0.85 ug{FEU}/mL — AB (ref 0.00–0.50)

## 2017-02-15 MED ORDER — MAGNESIUM SULFATE IN D5W 1-5 GM/100ML-% IV SOLN
1.0000 g | Freq: Once | INTRAVENOUS | Status: AC
  Administered 2017-02-15: 1 g via INTRAVENOUS
  Filled 2017-02-15: qty 100

## 2017-02-15 MED ORDER — IOPAMIDOL (ISOVUE-370) INJECTION 76%
INTRAVENOUS | Status: AC
  Administered 2017-02-15: 100 mL
  Filled 2017-02-15: qty 100

## 2017-02-15 MED ORDER — ACETAMINOPHEN 325 MG PO TABS
650.0000 mg | ORAL_TABLET | Freq: Once | ORAL | Status: AC
  Administered 2017-02-15: 650 mg via ORAL
  Filled 2017-02-15: qty 2

## 2017-02-15 MED ORDER — FLUTICASONE PROPIONATE 50 MCG/ACT NA SUSP
2.0000 | Freq: Every day | NASAL | Status: DC | PRN
Start: 2017-02-15 — End: 2017-02-16
  Filled 2017-02-15: qty 16

## 2017-02-15 MED ORDER — METHOCARBAMOL 500 MG PO TABS
500.0000 mg | ORAL_TABLET | Freq: Four times a day (QID) | ORAL | Status: DC | PRN
Start: 2017-02-15 — End: 2017-02-16

## 2017-02-15 MED ORDER — BISACODYL 5 MG PO TBEC: 5.0000 mg | DELAYED_RELEASE_TABLET | Freq: Every day | ORAL | Status: DC | PRN

## 2017-02-15 MED ORDER — ZOLPIDEM TARTRATE 5 MG PO TABS: 5.0000 mg | ORAL_TABLET | Freq: Every day | ORAL | Status: DC

## 2017-02-15 MED ORDER — OMEGA-3-ACID ETHYL ESTERS 1 G PO CAPS
2.0000 g | ORAL_CAPSULE | Freq: Every day | ORAL | Status: DC
  Administered 2017-02-16: 2 g via ORAL
  Filled 2017-02-15 (×2): qty 2

## 2017-02-15 MED ORDER — POTASSIUM CHLORIDE CRYS ER 20 MEQ PO TBCR
20.0000 meq | EXTENDED_RELEASE_TABLET | Freq: Every day | ORAL | Status: DC
  Administered 2017-02-16: 20 meq via ORAL
  Filled 2017-02-15: qty 1

## 2017-02-15 MED ORDER — SENNOSIDES-DOCUSATE SODIUM 8.6-50 MG PO TABS: 1.0000 | ORAL_TABLET | Freq: Every evening | ORAL | Status: DC | PRN

## 2017-02-15 MED ORDER — CELECOXIB 200 MG PO CAPS
200.0000 mg | ORAL_CAPSULE | Freq: Every day | ORAL | Status: DC
  Administered 2017-02-16: 200 mg via ORAL
  Filled 2017-02-15 (×2): qty 1

## 2017-02-15 MED ORDER — ENOXAPARIN SODIUM 40 MG/0.4ML ~~LOC~~ SOLN
40.0000 mg | SUBCUTANEOUS | Status: DC
  Administered 2017-02-15: 40 mg via SUBCUTANEOUS
  Filled 2017-02-15 (×2): qty 0.4

## 2017-02-15 MED ORDER — POTASSIUM CHLORIDE CRYS ER 20 MEQ PO TBCR
40.0000 meq | EXTENDED_RELEASE_TABLET | Freq: Once | ORAL | Status: AC
  Administered 2017-02-15: 40 meq via ORAL
  Filled 2017-02-15: qty 2

## 2017-02-15 MED ORDER — ACETAMINOPHEN 650 MG RE SUPP: 650.0000 mg | Freq: Four times a day (QID) | RECTAL | Status: DC | PRN

## 2017-02-15 MED ORDER — OXYCODONE HCL 5 MG PO TABS: 10.0000 mg | ORAL_TABLET | ORAL | Status: DC | PRN

## 2017-02-15 MED ORDER — ACETAMINOPHEN 325 MG PO TABS
650.0000 mg | ORAL_TABLET | Freq: Four times a day (QID) | ORAL | Status: DC | PRN
  Administered 2017-02-16: 650 mg via ORAL
  Filled 2017-02-15: qty 2

## 2017-02-15 MED ORDER — POTASSIUM CHLORIDE IN NACL 20-0.9 MEQ/L-% IV SOLN
INTRAVENOUS | Status: DC
  Administered 2017-02-15: 22:00:00 via INTRAVENOUS
  Filled 2017-02-15: qty 1000

## 2017-02-15 MED ORDER — VITAMIN D 1000 UNITS PO TABS
1000.0000 [IU] | ORAL_TABLET | Freq: Every day | ORAL | Status: DC
  Administered 2017-02-16: 1000 [IU] via ORAL
  Filled 2017-02-15: qty 1

## 2017-02-15 MED ORDER — SODIUM CHLORIDE 0.9% FLUSH: 3.0000 mL | Freq: Two times a day (BID) | INTRAVENOUS | Status: DC

## 2017-02-15 MED ORDER — HYDRALAZINE HCL 20 MG/ML IJ SOLN: 10.0000 mg | INTRAMUSCULAR | Status: DC | PRN

## 2017-02-15 MED ORDER — SODIUM CHLORIDE 0.9 % IV BOLUS (SEPSIS)
1000.0000 mL | Freq: Once | INTRAVENOUS | Status: AC
  Administered 2017-02-15: 1000 mL via INTRAVENOUS

## 2017-02-15 MED ORDER — ASPIRIN EC 81 MG PO TBEC
81.0000 mg | DELAYED_RELEASE_TABLET | Freq: Every day | ORAL | Status: DC
  Administered 2017-02-16: 81 mg via ORAL
  Filled 2017-02-15: qty 1

## 2017-02-15 NOTE — ED Provider Notes (Signed)
[facility] EMERGENCY DEPARTMENT Provider Note   CSN: [id number] Arrival date & time: 02/15/17  1511     History   Chief Complaint Chief Complaint  Patient presents with  . Near Syncope    HPI Shelia [name] is a 71 y.o. female.  HPI 71 year old female with a history of back pain status post lumbar surgery in July 2018 presents with near syncope and lightheadedness.  She states when she woke up she did not quite feel right and felt fatigued all day.  She has had some mild shortness of breath.  She had a lumbar injection a few days ago which contained a steroid.  She had a mild headache when she woke up but this went away.  Throughout the day she has been worse and around 2 PM she was getting a haircut and while sitting she had trouble talking due to dyspnea.  It was not severe but she could tell that she could get all of her words out like typical.  Some heaviness that she associated with shortness of breath on her chest.  This has been progressive to the point that around 3 or 330 they were eating dinner and she became so lightheaded that she almost passed out.  Husband stated that she looks quite ill and was not responding like normal but was awake and never passed out.  This lasted several minutes until he can get her to the car and bring her here.  She has better now but she still feels somewhat short of breath and lightheaded.  Has a mild headache now but has never had a severe headache.  No focal weakness.  No abdominal pain or vomiting but has felt nauseated.  No palpitations.  No history of DVT and has chronic left foot swelling but no other leg swelling.  Past Medical History:  Diagnosis Date  . Cystitis   . Depression   . DJD (degenerative joint disease)   . GERD (gastroesophageal reflux disease)   . Hyperlipidemia   . Hypertension   . Irritable bowel syndrome (IBS)   . PONV (postoperative nausea and vomiting)    with gallbladder surgery 2014  . Spinal  stenosis     Patient Active Problem List   Diagnosis Date Noted  . Status post lumbar surgery 09/11/2016  . Back pain 09/11/2016    Past Surgical History:  Procedure Laterality Date  . ABDOMINAL HYSTERECTOMY  1981   [facility]  . [place]   l-5,s-1  . BACK SURGERY  2000   l-5,s-1  . BACK SURGERY  2002   l-5,s-1  . BACK SURGERY  2003   l-5,s-2  . [place]   c-4,c-5  . [place]   c-4,c-5  . CHOLECYSTECTOMY     lap  . COLONOSCOPY  2008   polyps  . hemorrhoid banding  2011  . LUMBAR LAMINECTOMY/DECOMPRESSION MICRODISCECTOMY N/A 09/11/2016   Procedure: Lumbar three-five decompression, insitu fusion Lumbar fhree-five,  removal of lumbar hardware Lumbar five-Sacral one;  Surgeon: Melina Schools, MD;  Location: [facility];  Service: Orthopedics;  Laterality: N/A;  3.5 hrs    OB History    Gravida Para Term Preterm AB Living   2 2 2  0 0 2   SAB TAB Ectopic Multiple Live Births   0 0 0 0 2       Home Medications    Prior to Admission medications   Medication Sig Start Date  End Date Taking? Authorizing Provider  aspirin 81 MG tablet Take 81 mg by mouth daily.    [provider]  Black Cohosh-SoyIsoflav-Magnol (ESTROVEN MENOPAUSE RELIEF PO) Take 1 capsule by mouth daily. 07/10/16   [provider]  celecoxib (CELEBREX) 200 MG capsule Take 200 mg by mouth daily.  08/10/16   [provider]  cholecalciferol (VITAMIN D) 1000 UNITS tablet Take 1,000 Units by mouth daily.    [provider]  clobetasol ointment (TEMOVATE) 2.95 % Apply 1 application topically 2 (two) times daily. Patient not taking: Reported on 09/01/2016 04/02/16   Kem Boroughs, FNP  fish oil-omega-3 fatty acids 1000 MG capsule Take 2 g by mouth daily.      [provider]  fluticasone (FLONASE) 50 MCG/ACT nasal spray Place 2 sprays into both nostrils daily as needed for allergies or rhinitis.    [provider]  gabapentin  (NEURONTIN) 300 MG capsule Take 600 mg by mouth at bedtime.  08/16/16   [provider]  losartan-hydrochlorothiazide (HYZAAR) 100-25 MG per tablet Take 1 tablet by mouth daily.      [provider]  methocarbamol (ROBAXIN) 500 MG tablet Take 1 tablet (500 mg total) by mouth 3 (three) times daily. 09/12/16   Mayo, Darla Lesches, PA-C  Multiple Vitamins-Minerals (MULTIVITAMIN PO) Take 1 tablet by mouth daily.     [provider]  ondansetron (ZOFRAN) 4 MG tablet Take 1 tablet (4 mg total) by mouth every 6 (six) hours as needed for nausea or vomiting. 09/12/16   Mayo, Darla Lesches, PA-C  oxyCODONE 10 MG TABS Take 1 tablet (10 mg total) by mouth every 4 (four) hours as needed for severe pain. 09/12/16   Mayo, Darla Lesches, PA-C  potassium chloride SA (K-DUR,KLOR-CON) 20 MEQ tablet Take 20 mEq by mouth 2 (two) times daily.      [provider]  triamterene-hydrochlorothiazide (MAXZIDE-25) 37.5-25 MG tablet Take 0.5 tablets by mouth 2 (two) times daily.    [provider]  zolpidem (AMBIEN) 10 MG tablet Take 10 mg by mouth at bedtime.     [provider]    Family History Family History  Problem Relation Age of Onset  . Heart disease Mother   . Heart disease Father   . Hemochromatosis Daughter     Social History Social History   Tobacco Use  . Smoking status: Never Smoker  . Smokeless tobacco: Never Used  Substance Use Topics  . Alcohol use: No  . Drug use: No     Allergies   No known allergies   Review of Systems Review of Systems  Constitutional: Negative for fever.  Respiratory: Positive for shortness of breath.   Cardiovascular: Positive for chest pain. Negative for leg swelling.  Gastrointestinal: Positive for nausea. Negative for abdominal pain.  Musculoskeletal: Positive for back pain.  Neurological: Positive for light-headedness and headaches. Negative for syncope and weakness.  All other systems reviewed and  are negative.    Physical Exam Updated Vital Signs BP (!) 117/91   Pulse 79   Temp (!) 97.5 F (36.4 C) (Oral)   Resp 17   LMP 08/02/1979   SpO2 100%   Physical Exam  Constitutional: She is oriented to person, place, and time. She appears well-developed and well-nourished.  HENT:  Head: Normocephalic and atraumatic.  Right Ear: External ear normal.  Left Ear: External ear normal.  Nose: Nose normal.  Eyes: EOM are normal. Pupils are equal, round, and reactive to light. Right  eye exhibits no discharge. Left eye exhibits no discharge.  Neck: Neck supple.  Cardiovascular: Normal rate, regular rhythm and normal heart sounds.  No murmur heard. Pulmonary/Chest: Effort normal and breath sounds normal. She has no wheezes. She has no rales.  Abdominal: Soft. There is no tenderness.  Neurological: She is alert and oriented to person, place, and time.  CN 3-12 grossly intact. 5/5 strength in all 4 extremities. Grossly normal sensation. Normal finger to nose.   Skin: Skin is warm and dry.  Nursing note and vitals reviewed.    ED Treatments / Results  Labs (all labs ordered are listed, but only abnormal results are displayed) Labs Reviewed  BASIC METABOLIC PANEL - Abnormal; Notable for the following components:      Result Value   Potassium 3.0 (*)    Glucose, Bld 112 (*)    Creatinine, Ser 1.19 (*)    GFR calc non Af Amer 45 (*)    GFR calc Af Amer 52 (*)    All other components within normal limits  URINALYSIS, ROUTINE W REFLEX MICROSCOPIC - Abnormal; Notable for the following components:   Hgb urine dipstick MODERATE (*)    Leukocytes, UA TRACE (*)    Squamous Epithelial / LPF 0-5 (*)    All other components within normal limits  D-DIMER, QUANTITATIVE (NOT AT [facility]) - Abnormal; Notable for the following components:   D-Dimer, Quant 0.85 (*)    All other components within normal limits  CBC  TROPONIN I  BASIC METABOLIC PANEL  MAGNESIUM  CBC  CBG MONITORING, ED     EKG  EKG Interpretation  Date/Time:  Sunday February 15 2017 15:18:11 EST Ventricular Rate:  77 PR Interval:  90 QRS Duration: 78 QT Interval:  390 QTC Calculation: 441 R Axis:   89 Text Interpretation:  Sinus rhythm with short PR Otherwise normal ECG tachycardia resolved since 2003 Confirmed by Sherwood Gambler [phone]) on 02/15/2017 4:07:05 PM       Radiology Dg Chest 2 View  Result Date: 02/15/2017 CLINICAL DATA:  Syncope.  Weakness. EXAM: CHEST  2 VIEW COMPARISON:  August 02, 2010 FINDINGS: Scarring in the bilateral apices is unchanged on the left and slightly increased on the right in the interval. No pneumothorax. The heart, hila, and mediastinum are normal. No pulmonary nodules or masses. No focal infiltrates. IMPRESSION: No active cardiopulmonary disease. Electronically Signed   By: Dorise Bullion III M.D   On: 02/15/2017 17:00   Ct Head Wo Contrast  Result Date: 02/15/2017 CLINICAL DATA:  Dizziness EXAM: CT HEAD WITHOUT CONTRAST TECHNIQUE: Contiguous axial images were obtained from the base of the skull through the vertex without intravenous contrast. COMPARISON:  None. FINDINGS: Brain: Area of infarct noted in the right frontal lobe, age indeterminate. No hemorrhage or hydrocephalus. Vascular: No hyperdense vessel or unexpected calcification. Skull: No acute calvarial abnormality. Sinuses/Orbits: Visualized paranasal sinuses and mastoids clear. Orbital soft tissues unremarkable. 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 Electronically Signed   By: Rolm Baptise M.D.   On: 02/15/2017 20:48   Ct Angio Chest Pe W/cm &/or Wo Cm  Result Date: 02/15/2017 CLINICAL DATA:  Shortness of breath and chest tightness; dizziness EXAM: CT ANGIOGRAPHY CHEST WITH CONTRAST TECHNIQUE: Multidetector CT imaging of the chest was performed using the standard protocol during bolus administration of intravenous contrast. Multiplanar CT image reconstructions  and MIPs were obtained to evaluate the vascular anatomy. CONTRAST:  160mL ISOVUE-370 IOPAMIDOL (ISOVUE-370) INJECTION 76% COMPARISON:  Chest radiograph February 15, 2017 FINDINGS: Cardiovascular: There is no demonstrable pulmonary embolus. There is no thoracic aortic aneurysm or dissection. The visualized great vessels appear unremarkable except for modest calcification in the proximal left subclavian artery. There are scattered foci of coronary artery calcification. Pericardium is not appreciably thickened. There is no pericardial effusion. Mediastinum/Nodes: Thyroid appears unremarkable. There are scattered subcentimeter mediastinal lymph nodes. There is a sub- carinal lymph node measuring 1.2 x 1.1 cm. There is a small hiatal hernia. There is radiopaque material in the gastric cardia as well as in the esophagus of uncertain etiology. Lungs/Pleura: There is apical scarring with calcification. There is patchy bibasilar atelectasis. There is no lung edema or consolidation. There is no appreciable pleural effusion or pleural thickening. Upper Abdomen: In the visualized upper abdomen, gallbladder is absent. There are foci of atherosclerotic calcification in the upper abdominal aorta. Musculoskeletal: There are degenerative changes in the thoracic spine. There are no blastic or lytic bone lesions. Review of the MIP images confirms the above findings. IMPRESSION: 1.  No demonstrable pulmonary embolus. 2. Foci of aortic atherosclerosis and coronary artery calcification. 3. Atelectatic change in the lower lung zones. No edema or consolidation. 4. Mildly prominent sub- carinal lymph node of uncertain etiology. Several scattered sub- carinal lymph nodes are noted in the mediastinum and hilar regions. 5. Small hiatal hernia. Increased attenuation material hand gastric cardia esophagus. Question with the patient consumed a bismuth containing agent recently. 6.  Gallbladder absent. Aortic Atherosclerosis (ICD10-I70.0).  Electronically Signed   By: Lowella Grip III M.D.   On: 02/15/2017 20:57    Procedures Procedures (including critical care time)  Medications Ordered in ED Medications  aspirin EC tablet 81 mg (not administered)  cholecalciferol (VITAMIN D) tablet 1,000 Units (not administered)  omega-3 acid ethyl esters (LOVAZA) capsule 2 g (not administered)  fluticasone (FLONASE) 50 MCG/ACT nasal spray 2 spray (not administered)  methocarbamol (ROBAXIN) tablet 500 mg (not administered)  oxyCODONE (Oxy IR/ROXICODONE) immediate release tablet 10 mg (not administered)  celecoxib (CELEBREX) capsule 200 mg (not administered)  potassium chloride SA (K-DUR,KLOR-CON) CR tablet 20 mEq (not administered)  zolpidem (AMBIEN) tablet 5 mg (not administered)  sodium chloride flush (NS) 0.9 % injection 3 mL (3 mLs Intravenous Not Given 02/15/17 2336)  enoxaparin (LOVENOX) injection 40 mg (40 mg Subcutaneous Given 02/15/17 2332)  0.9 % NaCl with KCl 20 mEq/ L  infusion ( Intravenous Transfusing/Transfer 02/15/17 2246)  acetaminophen (TYLENOL) tablet 650 mg (not administered)    Or  acetaminophen (TYLENOL) suppository 650 mg (not administered)  senna-docusate (Senokot-S) tablet 1 tablet (not administered)  bisacodyl (DULCOLAX) EC tablet 5 mg (not administered)  hydrALAZINE (APRESOLINE) injection 10 mg (not administered)  sodium chloride 0.9 % bolus 1,000 mL (0 mLs Intravenous Stopped 02/15/17 1923)  potassium chloride SA (K-DUR,KLOR-CON) CR tablet 40 mEq (40 mEq Oral Given 02/15/17 1707)  acetaminophen (TYLENOL) tablet 650 mg (650 mg Oral Given 02/15/17 1815)  iopamidol (ISOVUE-370) 76 % injection (100 mLs  Contrast Given 02/15/17 1936)  magnesium sulfate IVPB 1 g 100 mL (1 g Intravenous Transfusing/Transfer 02/15/17 2246)     Initial Impression / Assessment and Plan / ED Course  I have reviewed the triage vital signs and the nursing notes.  Pertinent labs & imaging results that were available during my  care of the patient were reviewed by me and considered in my medical decision making (see chart for details).     Patient did not pass out but did have significant near  syncope.  There was no inciting factor.  Given her symptom of shortness of breath she was worked up for PE and MI.  She will need to continue to be observed as this is a higher risk scenario.  Likely needs an echocardiogram.  Has mild hypokalemia, start replacement in ED.  No obvious arrhythmias in the ED.  Admit to the hospitalist service.  Final Clinical Impressions(s) / ED Diagnoses   Final diagnoses:  Near syncope    ED Discharge Orders    None       Sherwood Gambler, MD 02/16/17 [phone]

## 2017-02-15 NOTE — ED Notes (Signed)
Patient transported to CT 

## 2017-02-15 NOTE — ED Notes (Signed)
Patient reports the room spinning a little and feeling "a bit dizzy" upon transitioning from a lying to a sitting position and then again from sitting to standing, states "I think it's because I looked at the floor and then looked up too quickly." Reports this feeling went away after a couple of seconds.

## 2017-02-15 NOTE — H&P (Signed)
History and Physical    Shelia [name] IEP:[id number] DOB: 07/19/1945 DOA: 02/15/2017  PCP: Prince Solian, MD   Patient coming from: Home  Chief Complaint: SOB, near-syncope   HPI: Shelia [name] is a 71 y.o. female with medical history significant for chronic back pain, hypertension, and hyperlipidemia, now presenting to the emergency department after a near syncopal episode.  Patient reports that she was experiencing nonspecific malaise since waking this morning, with difficulty describing this in any more detail.  She continued to go about her usual activities and went out for dinner this evening.  At dinner, she felt short of breath and warm with mild nausea.  Symptoms worsened and she became lightheaded, feeling as though she was about to pass out.  This occurred while she was seated and the lightheadedness gradually improved.  She denies experiencing similar symptoms previously.  There was no chest pain or palpitations and no headache or focal numbness or weakness associated with this.  No recent fevers or chills.  Reports shortness of breath associated with this episode, but denies any cough.  ED Course: Upon arrival to the ED, patient is found to be afebrile, saturating well on room air, and static vital signs are negative, patient reported vertigo upon standing.  EKG features a sinus rhythm with short PR interval and chest x-ray is negative for pulmonary disease.  Noncontrast head CT features and age-indeterminate cortical infarct at the right frontal lobe.  CTA chest is negative for PE and notable for bibasilar atelectasis.  Chemistry panel features a potassium of 3.0 and creatinine 1.19.  CBC is unremarkable patient was given a liter of normal saline and 40 mEq oral potassium in the ED.  Stable and in no apparent respiratory distress, and will be observed on the telemetry unit for ongoing evaluation and management of a near-syncopal episode.   Review of Systems:  All other  systems reviewed and apart from HPI, are negative.  Past Medical History:  Diagnosis Date  . Cystitis   . Depression   . DJD (degenerative joint disease)   . GERD (gastroesophageal reflux disease)   . Hyperlipidemia   . Hypertension   . Irritable bowel syndrome (IBS)   . PONV (postoperative nausea and vomiting)    with gallbladder surgery 2014  . Spinal stenosis     Past Surgical History:  Procedure Laterality Date  . ABDOMINAL HYSTERECTOMY  1981   [facility]  . [place]   l-5,s-1  . BACK SURGERY  2000   l-5,s-1  . BACK SURGERY  2002   l-5,s-1  . BACK SURGERY  2003   l-5,s-2  . [place]   c-4,c-5  . [place]   c-4,c-5  . CHOLECYSTECTOMY     lap  . COLONOSCOPY  2008   polyps  . hemorrhoid banding  2011  . LUMBAR LAMINECTOMY/DECOMPRESSION MICRODISCECTOMY N/A 09/11/2016   Procedure: Lumbar three-five decompression, insitu fusion Lumbar fhree-five,  removal of lumbar hardware Lumbar five-Sacral one;  Surgeon: Melina Schools, MD;  Location: [facility];  Service: Orthopedics;  Laterality: N/A;  3.5 hrs     reports that  has never smoked. she has never used smokeless tobacco. She reports that she does not drink alcohol or use drugs.  Allergies  Allergen Reactions  . No Known Allergies     Family History  Problem Relation Age of Onset  . Heart disease Mother   . Heart disease Father   . Hemochromatosis  Daughter      Prior to Admission medications   Medication Sig Start Date End Date Taking? Authorizing Provider  aspirin 81 MG tablet Take 81 mg by mouth daily.   Yes [provider]  celecoxib (CELEBREX) 200 MG capsule Take 200 mg by mouth daily.  08/10/16  Yes [provider]  cholecalciferol (VITAMIN D) 1000 UNITS tablet Take 1,000 Units by mouth daily.   Yes [provider]  fish oil-omega-3 fatty acids 1000 MG capsule Take 2 g by mouth daily.     Yes [provider]  fluticasone (FLONASE)  50 MCG/ACT nasal spray Place 2 sprays into both nostrils daily as needed for allergies or rhinitis.   Yes [provider]  Ibuprofen-Famotidine 800-26.6 MG TABS Take 1 tablet by mouth daily.   Yes [provider]  losartan-hydrochlorothiazide (HYZAAR) 100-25 MG per tablet Take 12.5 tablets by mouth daily.    Yes [provider]  methocarbamol (ROBAXIN) 500 MG tablet Take 1 tablet (500 mg total) by mouth 3 (three) times daily. Patient taking differently: Take 500 mg by mouth every 6 (six) hours as needed.  09/12/16  Yes Mayo, Darla Lesches, PA-C  Multiple Vitamins-Minerals (MULTIVITAMIN PO) Take 1 tablet by mouth daily.    Yes [provider]  oxyCODONE 10 MG TABS Take 1 tablet (10 mg total) by mouth every 4 (four) hours as needed for severe pain. 09/12/16  Yes Mayo, Darla Lesches, PA-C  potassium chloride SA (K-DUR,KLOR-CON) 20 MEQ tablet Take 20 mEq by mouth daily.    Yes [provider]  triamterene-hydrochlorothiazide (MAXZIDE-25) 37.5-25 MG tablet Take 1 tablet by mouth 2 (two) times daily.    Yes [provider]  zolpidem (AMBIEN) 10 MG tablet Take 10 mg by mouth at bedtime.    Yes [provider]    Physical Exam: Vitals:   02/15/17 1900 02/15/17 1930 02/15/17 2015 02/15/17 2139  BP: 134/65 109/90  (!) 143/78  Pulse: 77 80 88 72  Resp: 15 18 15 18   Temp:      TempSrc:      SpO2: 100% 100% 95% 98%      Constitutional: NAD, calm  Eyes: PERTLA, lids and conjunctivae normal ENMT: Mucous membranes are moist. Posterior pharynx clear of any exudate or lesions.   Neck: normal, supple, no masses, no thyromegaly Respiratory: clear to auscultation bilaterally, no wheezing, no crackles. Normal respiratory effort.  Cardiovascular: S1 & S2 heard, regular rate and rhythm. No extremity edema. No significant JVD. Abdomen: No distension, no tenderness, no masses palpated. Bowel sounds normal.  Musculoskeletal: no clubbing /  cyanosis. No joint deformity upper and lower extremities.  Skin: no significant rashes, lesions, ulcers. Warm, dry, well-perfused. Neurologic: CN 2-12 grossly intact. Sensation intact. Strength 5/5 in all 4 limbs.  Psychiatric: Alert and oriented x 3. Pleasant and cooperative.     Labs on Admission: I have personally reviewed following labs and imaging studies  CBC: Recent Labs  Lab 02/15/17 1534  WBC 9.3  HGB 12.2  HCT 37.8  MCV 88.7  PLT 194   Basic Metabolic Panel: Recent Labs  Lab 02/15/17 1534  NA 137  K 3.0*  CL 101  CO2 23  GLUCOSE 112*  BUN 20  CREATININE 1.19*  CALCIUM 9.6   GFR: CrCl cannot be calculated (Unknown ideal weight.). Liver Function Tests: No results for input(s): AST, ALT, ALKPHOS, BILITOT, PROT, ALBUMIN in the last 168 hours. No results for input(s): LIPASE, AMYLASE in the last 168  hours. No results for input(s): AMMONIA in the last 168 hours. Coagulation Profile: No results for input(s): INR, PROTIME in the last 168 hours. Cardiac Enzymes: Recent Labs  Lab 02/15/17 1712  TROPONINI <0.03   BNP (last 3 results) No results for input(s): PROBNP in the last 8760 hours. HbA1C: No results for input(s): HGBA1C in the last 72 hours. CBG: Recent Labs  Lab 02/15/17 1522  GLUCAP 74   Lipid Profile: No results for input(s): CHOL, HDL, LDLCALC, TRIG, CHOLHDL, LDLDIRECT in the last 72 hours. Thyroid Function Tests: No results for input(s): TSH, T4TOTAL, FREET4, T3FREE, THYROIDAB in the last 72 hours. Anemia Panel: No results for input(s): VITAMINB12, FOLATE, FERRITIN, TIBC, IRON, RETICCTPCT in the last 72 hours. Urine analysis:    Component Value Date/Time   COLORURINE YELLOW 02/15/2017 1537   APPEARANCEUR CLEAR 02/15/2017 1537   LABSPEC 1.013 02/15/2017 1537   PHURINE 7.0 02/15/2017 1537   GLUCOSEU NEGATIVE 02/15/2017 1537   HGBUR MODERATE (A) 02/15/2017 1537   BILIRUBINUR NEGATIVE 02/15/2017 1537   BILIRUBINUR N 09/01/2013 1518    KETONESUR NEGATIVE 02/15/2017 1537   PROTEINUR NEGATIVE 02/15/2017 1537   UROBILINOGEN negative 09/01/2013 1518   NITRITE NEGATIVE 02/15/2017 1537   LEUKOCYTESUR TRACE (A) 02/15/2017 1537   Sepsis Labs: @LABRCNTIP (procalcitonin:4,lacticidven:4) )No results found for this or any previous visit (from the past 240 hour(s)).   Radiological Exams on Admission: Dg Chest 2 View  Result Date: 02/15/2017 CLINICAL DATA:  Syncope.  Weakness. EXAM: CHEST  2 VIEW COMPARISON:  August 02, 2010 FINDINGS: Scarring in the bilateral apices is unchanged on the left and slightly increased on the right in the interval. No pneumothorax. The heart, hila, and mediastinum are normal. No pulmonary nodules or masses. No focal infiltrates. IMPRESSION: No active cardiopulmonary disease. Electronically Signed   By: Dorise Bullion III M.D   On: 02/15/2017 17:00   Ct Head Wo Contrast  Result Date: 02/15/2017 CLINICAL DATA:  Dizziness EXAM: CT HEAD WITHOUT CONTRAST TECHNIQUE: Contiguous axial images were obtained from the base of the skull through the vertex without intravenous contrast. COMPARISON:  None. FINDINGS: Brain: Area of infarct noted in the right frontal lobe, age indeterminate. No hemorrhage or hydrocephalus. Vascular: No hyperdense vessel or unexpected calcification. Skull: No acute calvarial abnormality. Sinuses/Orbits: Visualized paranasal sinuses and mastoids clear. Orbital soft tissues unremarkable. 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 Electronically Signed   By: Rolm Baptise M.D.   On: 02/15/2017 20:48   Ct Angio Chest Pe W/cm &/or Wo Cm  Result Date: 02/15/2017 CLINICAL DATA:  Shortness of breath and chest tightness; dizziness EXAM: CT ANGIOGRAPHY CHEST WITH CONTRAST TECHNIQUE: Multidetector CT imaging of the chest was performed using the standard protocol during bolus administration of intravenous contrast. Multiplanar CT image reconstructions and MIPs were  obtained to evaluate the vascular anatomy. CONTRAST:  145mL ISOVUE-370 IOPAMIDOL (ISOVUE-370) INJECTION 76% COMPARISON:  Chest radiograph February 15, 2017 FINDINGS: Cardiovascular: There is no demonstrable pulmonary embolus. There is no thoracic aortic aneurysm or dissection. The visualized great vessels appear unremarkable except for modest calcification in the proximal left subclavian artery. There are scattered foci of coronary artery calcification. Pericardium is not appreciably thickened. There is no pericardial effusion. Mediastinum/Nodes: Thyroid appears unremarkable. There are scattered subcentimeter mediastinal lymph nodes. There is a sub- carinal lymph node measuring 1.2 x 1.1 cm. There is a small hiatal hernia. There is radiopaque material in the gastric cardia as well as in the esophagus of uncertain etiology.  Lungs/Pleura: There is apical scarring with calcification. There is patchy bibasilar atelectasis. There is no lung edema or consolidation. There is no appreciable pleural effusion or pleural thickening. Upper Abdomen: In the visualized upper abdomen, gallbladder is absent. There are foci of atherosclerotic calcification in the upper abdominal aorta. Musculoskeletal: There are degenerative changes in the thoracic spine. There are no blastic or lytic bone lesions. Review of the MIP images confirms the above findings. IMPRESSION: 1.  No demonstrable pulmonary embolus. 2. Foci of aortic atherosclerosis and coronary artery calcification. 3. Atelectatic change in the lower lung zones. No edema or consolidation. 4. Mildly prominent sub- carinal lymph node of uncertain etiology. Several scattered sub- carinal lymph nodes are noted in the mediastinum and hilar regions. 5. Small hiatal hernia. Increased attenuation material hand gastric cardia esophagus. Question with the patient consumed a bismuth containing agent recently. 6.  Gallbladder absent. Aortic Atherosclerosis (ICD10-I70.0). Electronically Signed    By: Lowella Grip III M.D.   On: 02/15/2017 20:57    EKG: Independently reviewed. Sinus rhythm with short PR interval.   Assessment/Plan  1. Near-syncope  - Pt presents following a near-syncopal episode associated with subjective SOB, reported vertigo with orthostatic vitals in ED   - CTA chest negative for PE or acute cardiopulmonary disease, EKG unremarkable, head CT with age-indeterminate stroke involving right frontal lobe  - She was treated with 1 liter NS in ED  - No orthostatic BP changes, though pt reported vertigo upon standing  - Plan to continue cardiac monitoring, evaluate further with echocardiogram and MRI brain    2. Hypokalemia  - Serum potassium is 3.0 on admission  - Treated with 40 mEq oral potassium in ED  - Triamterene and HCTZ held on admission  - Continue cardiac monitoring, repeat chemistries in am    3. Hypertension  - BP at goal  - Hold diuretics in light of clinical hypovolemia and electrolyte derangements  - Use hydralazine IVP's prn for now   4. Chronic low back pain  - Stable  - Continue home-regimen with Celebrex and prn Robaxin and oxycodone     DVT prophylaxis: Lovenox  Code Status: Full  Family Communication: Husband updated at bedside Disposition Plan: Observe on telemetry Consults called: None Admission status: Observation    Vianne Bulls, MD [facility] Pager [phone]  If 7PM-7AM, please contact night-coverage [email or web address] Password [facility]  02/15/2017, 9:51 PM

## 2017-02-15 NOTE — ED Notes (Signed)
Assisted patient to restroom via wheelchair. Pt able to transfer from bed to wheelchair and back without difficulty.

## 2017-02-15 NOTE — ED Triage Notes (Signed)
Pt reports not feeling well then went to get something to eat, had near syncopal episode. Pt felt lightheaded, weak and sob. spo2 100% at triage.

## 2017-02-15 NOTE — ED Notes (Signed)
Pt transported to xray 

## 2017-02-15 NOTE — ED Notes (Signed)
Hospitalist at bedside 

## 2017-02-16 ENCOUNTER — Observation Stay (HOSPITAL_BASED_OUTPATIENT_CLINIC_OR_DEPARTMENT_OTHER): Payer: Medicare Other

## 2017-02-16 ENCOUNTER — Observation Stay (HOSPITAL_COMMUNITY): Payer: Medicare Other

## 2017-02-16 DIAGNOSIS — E876 Hypokalemia: Secondary | ICD-10-CM | POA: Diagnosis not present

## 2017-02-16 DIAGNOSIS — R55 Syncope and collapse: Secondary | ICD-10-CM | POA: Diagnosis not present

## 2017-02-16 DIAGNOSIS — I340 Nonrheumatic mitral (valve) insufficiency: Secondary | ICD-10-CM | POA: Diagnosis not present

## 2017-02-16 LAB — CBC
HEMATOCRIT: 34.6 % — AB (ref 36.0–46.0)
Hemoglobin: 11.1 g/dL — ABNORMAL LOW (ref 12.0–15.0)
MCH: 28.2 pg (ref 26.0–34.0)
MCHC: 32.1 g/dL (ref 30.0–36.0)
MCV: 88 fL (ref 78.0–100.0)
Platelets: 280 10*3/uL (ref 150–400)
RBC: 3.93 MIL/uL (ref 3.87–5.11)
RDW: 13.9 % (ref 11.5–15.5)
WBC: 6.8 10*3/uL (ref 4.0–10.5)

## 2017-02-16 LAB — BASIC METABOLIC PANEL
Anion gap: 9 (ref 5–15)
BUN: 15 mg/dL (ref 6–20)
CHLORIDE: 107 mmol/L (ref 101–111)
CO2: 25 mmol/L (ref 22–32)
CREATININE: 1.04 mg/dL — AB (ref 0.44–1.00)
Calcium: 9.1 mg/dL (ref 8.9–10.3)
GFR calc Af Amer: >60 mL/min (ref >60)
GFR calc non Af Amer: 53 mL/min — ABNORMAL LOW (ref >60)
Glucose, Bld: 98 mg/dL (ref 65–99)
Potassium: 3.5 mmol/L (ref 3.5–5.1)
SODIUM: 141 mmol/L (ref 135–145)

## 2017-02-16 LAB — ECHOCARDIOGRAM COMPLETE
Height: 67 in
Weight: 2716.07 [oz_av]

## 2017-02-16 LAB — CK: Total CK: 151 U/L (ref 38–234)

## 2017-02-16 LAB — MAGNESIUM: Magnesium: 1.8 mg/dL (ref 1.7–2.4)

## 2017-02-16 LAB — GLUCOSE, CAPILLARY: Glucose-Capillary: 91 mg/dL (ref 65–99)

## 2017-02-16 NOTE — Discharge Summary (Signed)
Physician Discharge Summary  Shelia [name] GHW:[id number] DOB: 05-29-1945 DOA: 02/15/2017  PCP: Prince Solian, MD  Admit date: 02/15/2017 Discharge date: 02/16/2017   Recommendations for Outpatient Follow-Up:   BP check 1 week-- may need addition of meds-- have stopped HCTZ combo for now-- BP normal and patient appeared to be dehydrated Consider outpatient event monitor-- tele here unrevealing Patient with Mildly prominent sub- carinal lymph node of uncertain etiology. Several scattered sub- carinal lymph nodes are noted in the mediastinum and hilar regions.   Discharge Diagnosis:   Principal Problem:   Near syncope Active Problems:   Chronic low back pain   Hypertension   Hypokalemia   Discharge disposition:  Home:  Discharge Condition: Improved.  Diet recommendation: Low sodium, heart healthy  Wound care: None.   History of Present Illness:   Shelia [name] is a 71 y.o. female with medical history significant for chronic back pain, hypertension, and hyperlipidemia, now presenting to the emergency department after a near syncopal episode.  Patient reports that she was experiencing nonspecific malaise since waking this morning, with difficulty describing this in any more detail.  She continued to go about her usual activities and went out for dinner this evening.  At dinner, she felt short of breath and warm with mild nausea.  Symptoms worsened and she became lightheaded, feeling as though she was about to pass out.  This occurred while she was seated and the lightheadedness gradually improved.  She denies experiencing similar symptoms previously.  There was no chest pain or palpitations and no headache or focal numbness or weakness associated with this.  No recent fevers or chills.  Reports shortness of breath associated with this episode, but denies any cough.     Hospital Course by Problem:   1. 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 Hypokalemia  - Serum potassium is 3.0 on admission  - repleted - Triamterene and HCTZ held  3. Hypertension  - BP at goal without medications - d/c diuretics in light of clinical hypovolemia and electrolyte derangements   4. Chronic low back pain  - Stable  - Continue home-regimen with Celebrex and prn Robaxin and oxycodone        Medical Consultants:    None.   Discharge Exam:   Vitals:   02/16/17 0948 02/16/17 1258  BP: 140/65 130/65  Pulse: 72 79  Resp: 20 20  Temp: 98.7 F (37.1 C) 98.6 F (37 C)  SpO2: 99% 100%   Vitals:   02/16/17 0038 02/16/17 0636 02/16/17 0948 02/16/17 1258  BP: (!) 119/48 (!) 143/73 140/65 130/65  Pulse: 71 65 72 79  Resp: 16 18 20 20   Temp: 98.4 F (36.9 C) 98.7 F (37.1 C) 98.7 F (37.1 C) 98.6 F (37 C)  TempSrc: Oral Oral Oral Oral  SpO2: 100% 98% 99% 100%  Weight:      Height:        Gen:  NAD-- up walking the unit with no symptoms  The results of significant diagnostics from this hospitalization (including imaging, microbiology, ancillary and laboratory) are listed below for reference.  Procedures and Diagnostic Studies:   Dg Chest 2 View  Result Date: 02/15/2017 CLINICAL DATA:  Syncope.  Weakness. EXAM: CHEST  2 VIEW COMPARISON:  August 02, 2010 FINDINGS: Scarring in the bilateral apices is unchanged on the left and slightly increased on the right in the interval. No pneumothorax. The heart, hila, and mediastinum are normal. No pulmonary nodules or masses. No focal infiltrates. IMPRESSION: No active cardiopulmonary disease. Electronically Signed   By: Dorise Bullion III M.D   On: 02/15/2017 17:00   Ct Head Wo Contrast  Result Date: 02/15/2017 CLINICAL DATA:  Dizziness EXAM: CT HEAD WITHOUT CONTRAST TECHNIQUE: Contiguous axial images were obtained from the base of the skull through the vertex without intravenous contrast. COMPARISON:  None. FINDINGS: Brain: Area of infarct noted in the right frontal lobe, age indeterminate. No hemorrhage or hydrocephalus. Vascular: No hyperdense vessel or unexpected calcification. Skull: No acute calvarial abnormality. Sinuses/Orbits: Visualized paranasal sinuses and mastoids clear. Orbital soft tissues unremarkable. 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 Electronically Signed   By: Rolm Baptise M.D.   On: 02/15/2017 20:48   Ct Angio Chest Pe W/cm &/or Wo Cm  Result Date: 02/15/2017 CLINICAL DATA:  Shortness of breath and chest tightness; dizziness EXAM: CT ANGIOGRAPHY CHEST WITH CONTRAST TECHNIQUE: Multidetector CT imaging of the chest was performed using the standard protocol during bolus administration of intravenous contrast. Multiplanar CT image reconstructions and MIPs were obtained to evaluate the vascular anatomy. CONTRAST:  165mL ISOVUE-370 IOPAMIDOL (ISOVUE-370) INJECTION 76% COMPARISON:  Chest radiograph February 15, 2017 FINDINGS: Cardiovascular: There is no demonstrable pulmonary embolus. There is no thoracic aortic aneurysm or dissection. The visualized great vessels appear unremarkable except for modest calcification in the proximal left subclavian artery. There are scattered foci of coronary artery calcification. Pericardium is not appreciably thickened. There is no pericardial effusion. Mediastinum/Nodes: Thyroid appears unremarkable. There are scattered subcentimeter mediastinal lymph nodes. There is a sub- carinal lymph node measuring 1.2 x 1.1 cm. There is a small hiatal hernia. There is radiopaque material in the gastric cardia as well as in the esophagus of  uncertain etiology. Lungs/Pleura: There is apical scarring with calcification. There is patchy bibasilar atelectasis. There is no lung edema or consolidation. There is no appreciable pleural effusion or pleural thickening. Upper Abdomen: In the visualized upper abdomen, gallbladder is absent. There are foci of atherosclerotic calcification in the upper abdominal aorta. Musculoskeletal: There are degenerative changes in the thoracic spine. There are no blastic or lytic bone lesions. Review of the MIP images confirms the above findings. IMPRESSION: 1.  No demonstrable pulmonary embolus. 2. Foci of aortic atherosclerosis and coronary artery calcification. 3. Atelectatic change in the lower lung zones. No edema or consolidation. 4. Mildly prominent sub- carinal lymph node of uncertain etiology. Several scattered sub- carinal lymph nodes are noted in the mediastinum and hilar regions. 5. Small hiatal hernia. Increased attenuation material hand gastric cardia esophagus. Question with the patient consumed a bismuth containing agent recently. 6.  Gallbladder absent. Aortic Atherosclerosis (ICD10-I70.0). Electronically Signed   By: Lowella Grip III M.D.   On: 02/15/2017 20:57   Mr Brain Wo Contrast  Result Date: 02/16/2017 CLINICAL DATA:  Syncope, simple, with abnormal neurological exam. Abnormal head CT. The legs today. EXAM: MRI HEAD WITHOUT CONTRAST TECHNIQUE: Multiplanar, multiecho pulse sequences of the brain and surrounding structures were obtained without intravenous contrast. COMPARISON:  Head CT from yesterday FINDINGS: Brain: Wedge of anterior and inferior right frontal encephalomalacia  and gliosis consistent with old infarct. No acute infarct, hemorrhage, hydrocephalus, or masslike finding. Scattered FLAIR hyperintensities in the cerebral white matter attributed to chronic small vessel ischemia, overall mild. Dilated perivascular spaces throughout the cerebral white matter, deep gray nuclei, and upper  brainstem. Age normal brain volume. Vascular: Major flow voids are preserved. Skull and upper cervical spine: C4-5 ACDF Sinuses/Orbits: Small mucous retention cysts in the maxillary sinuses. No acute finding. IMPRESSION: No acute finding. The small right frontal infarct noted on previous head CT is remote. Electronically Signed   By: Monte Fantasia M.D.   On: 02/16/2017 06:27     Labs:   Basic Metabolic Panel: Recent Labs  Lab 02/15/17 1534 02/16/17 0706  NA 137 141  K 3.0* 3.5  CL 101 107  CO2 23 25  GLUCOSE 112* 98  BUN 20 15  CREATININE 1.19* 1.04*  CALCIUM 9.6 9.1  MG  --  1.8   GFR Estimated Creatinine Clearance: 53.1 mL/min (A) (by C-G formula based on SCr of 1.04 mg/dL (H)). Liver Function Tests: No results for input(s): AST, ALT, ALKPHOS, BILITOT, PROT, ALBUMIN in the last 168 hours. No results for input(s): LIPASE, AMYLASE in the last 168 hours. No results for input(s): AMMONIA in the last 168 hours. Coagulation profile No results for input(s): INR, PROTIME in the last 168 hours.  CBC: Recent Labs  Lab 02/15/17 1534 02/16/17 0706  WBC 9.3 6.8  HGB 12.2 11.1*  HCT 37.8 34.6*  MCV 88.7 88.0  PLT 317 280   Cardiac Enzymes: Recent Labs  Lab 02/15/17 1712 02/16/17 0706  CKTOTAL  --  151  TROPONINI <0.03  --    BNP: Invalid input(s): POCBNP CBG: Recent Labs  Lab 02/15/17 1522 02/16/17 0614  GLUCAP 74 91   D-Dimer Recent Labs    02/15/17 1712  DDIMER 0.85*   Hgb A1c No results for input(s): HGBA1C in the last 72 hours. Lipid Profile No results for input(s): CHOL, HDL, LDLCALC, TRIG, CHOLHDL, LDLDIRECT in the last 72 hours. Thyroid function studies No results for input(s): TSH, T4TOTAL, T3FREE, THYROIDAB in the last 72 hours.  Invalid input(s): FREET3 Anemia work up No results for input(s): VITAMINB12, FOLATE, FERRITIN, TIBC, IRON, RETICCTPCT in the last 72 hours. Microbiology No results found for this or any previous visit (from the past  240 hour(s)).   Discharge Instructions:   Discharge Instructions    Diet - low sodium heart healthy   Complete by:  As directed    Discharge instructions   Complete by:  As directed    Monitor blood pressure at home- bring log to PCP   Increase activity slowly   Complete by:  As directed      Allergies as of 02/16/2017      Reactions   No Known Allergies       Medication List    STOP taking these medications   Ibuprofen-Famotidine 800-26.6 MG Tabs   losartan-hydrochlorothiazide 100-25 MG tablet Commonly known as:  HYZAAR   MULTIVITAMIN PO   potassium chloride SA 20 MEQ tablet Commonly known as:  K-DUR,KLOR-CON   triamterene-hydrochlorothiazide 37.5-25 MG tablet Commonly known as:  MAXZIDE-25     TAKE these medications   aspirin 81 MG tablet Take 81 mg by mouth daily.   celecoxib 200 MG capsule Commonly known as:  CELEBREX Take 200 mg by mouth daily.   cholecalciferol 1000 units tablet Commonly known as:  VITAMIN D Take 1,000 Units by mouth daily.   fish oil-omega-3  fatty acids 1000 MG capsule Take 2 g by mouth daily.   fluticasone 50 MCG/ACT nasal spray Commonly known as:  FLONASE Place 2 sprays into both nostrils daily as needed for allergies or rhinitis.   methocarbamol 500 MG tablet Commonly known as:  ROBAXIN Take 1 tablet (500 mg total) by mouth 3 (three) times daily. What changed:    when to take this  reasons to take this   Oxycodone HCl 10 MG Tabs Take 1 tablet (10 mg total) by mouth every 4 (four) hours as needed for severe pain.   zolpidem 10 MG tablet Commonly known as:  AMBIEN Take 10 mg by mouth at bedtime.      Follow-up Information    Avva, Ravisankar, MD Follow up in 1 week(s).   Specialty:  Internal Medicine Why:  BP check and BMP Contact information: [address] [place] [address] [phone]            Time coordinating discharge: 35 min  Signed:  Geradine Girt   [facility] 02/16/2017,  5:48 PM

## 2017-02-16 NOTE — Progress Notes (Signed)
Pt D/C home. Pt is stable, no new concerns, d/c instructions done with teach back, pt verbalize understanding. Pt is transported from the hospital by spouse.

## 2017-02-16 NOTE — Progress Notes (Signed)
Arrived from ED at 2300. Alert and oriented. C/o of back pain 4/10.  Ambulatory to the restroom, stated feeling a little dizzy getting back from the restroom. Dizziness subsided after getting in back.  Call light within reach.

## 2017-02-16 NOTE — Progress Notes (Signed)
  Echocardiogram 2D Echocardiogram has been performed.  Dynastie Knoop T Kennisha Qin 02/16/2017, 11:47 AM

## 2017-02-16 NOTE — Discharge Instructions (Signed)
Near-Syncope °Near-syncope is when you suddenly get weak or dizzy, or you feel like you might pass out (faint). During an episode of near-syncope, you may: °· Feel dizzy or light-headed. °· Feel sick to your stomach (nauseous). °· See all white or all black. °· Have cold, clammy skin. ° °If you passed out, get help right away.Call your local emergency services (911 in the [place] Do not drive yourself to the hospital. °Follow these instructions at home: °Pay attention to any changes in your symptoms. Take these actions to help with your condition: °· Have someone stay with you until you feel stable. °· Do not drive, use machinery, or play sports until your doctor says it is okay. °· Keep all follow-up visits as told by your doctor. This is important. °· If you start to feel like you might pass out, lie down right away and raise (elevate) your feet above the level of your heart. Breathe deeply and steadily. Wait until all of the symptoms are gone. °· Drink enough fluid to keep your pee (urine) clear or pale yellow. °· If you are taking blood pressure or heart medicine, get up slowly and spend many minutes getting ready to sit and then stand. This can help with dizziness. °· Take over-the-counter and prescription medicines only as told by your doctor. ° °Get help right away if: °· You have a very bad headache. °· You have unusual pain in your chest, tummy, or back. °· You are bleeding from your mouth or rectum. °· You have black or tarry poop (stool). °· You have a very fast or uneven heartbeat (palpitations). °· You pass out one time or more than once. °· You have jerky movements that you cannot control (seizure). °· You are confused. °· You have trouble walking. °· You are very weak. °· You have vision problems. °These symptoms may be an emergency. Do not wait to see if the symptoms will go away. Get medical help right away. Call your local emergency services (911 in the [place] Do not drive yourself to the  hospital. °This information is not intended to replace advice given to you by your health care provider. Make sure you discuss any questions you have with your health care provider. °Document Released: 08/06/2007 Document Revised: 07/26/2015 Document Reviewed: 11/01/2014 °Elsevier Interactive Patient Education © 2017 [employer] Inc[employer] ° °

## 2017-02-16 NOTE — Care Management Obs Status (Signed)
[facility] NOTIFICATION   Patient Details  Name: Shelia [name] MRN: [id number] Date of Birth: 10/22/1945   Medicare Observation Status Notification Given:  Yes    Pollie Friar, RN 02/16/2017, 1:57 PM

## 2017-02-16 NOTE — Care Management Note (Signed)
Case Management Note  Patient Details  Name: Shelia [name] MRN: [id number] Date of Birth: 12/03/1945  Subjective/Objective:                    Action/Plan: Pt discharging home with self care. Pt has PCP, insurance and transportation home. No further needs per CM.  Expected Discharge Date:  02/16/17               Expected Discharge Plan:  Home/Self Care  In-House Referral:     Discharge planning Services  CM Consult  Post Acute Care Choice:    Choice offered to:     DME Arranged:    DME Agency:     HH Arranged:    HH Agency:     Status of Service:  Completed, signed off  If discussed at [employer] of Stay Meetings, dates discussed:    Additional Comments:  Pollie Friar, RN 02/16/2017, 4:39 PM

## 2017-02-19 DIAGNOSIS — I10 Essential (primary) hypertension: Secondary | ICD-10-CM | POA: Diagnosis not present

## 2017-02-19 DIAGNOSIS — R55 Syncope and collapse: Secondary | ICD-10-CM | POA: Diagnosis not present

## 2017-02-19 DIAGNOSIS — D6489 Other specified anemias: Secondary | ICD-10-CM | POA: Diagnosis not present

## 2017-02-19 DIAGNOSIS — R8290 Unspecified abnormal findings in urine: Secondary | ICD-10-CM | POA: Diagnosis not present

## 2017-02-19 DIAGNOSIS — M48061 Spinal stenosis, lumbar region without neurogenic claudication: Secondary | ICD-10-CM | POA: Diagnosis not present

## 2017-02-19 DIAGNOSIS — Z8669 Personal history of other diseases of the nervous system and sense organs: Secondary | ICD-10-CM | POA: Diagnosis not present

## 2017-02-19 DIAGNOSIS — K589 Irritable bowel syndrome without diarrhea: Secondary | ICD-10-CM | POA: Diagnosis not present

## 2017-02-19 DIAGNOSIS — M859 Disorder of bone density and structure, unspecified: Secondary | ICD-10-CM | POA: Diagnosis not present

## 2017-02-19 DIAGNOSIS — E876 Hypokalemia: Secondary | ICD-10-CM | POA: Diagnosis not present

## 2017-03-16 DIAGNOSIS — E876 Hypokalemia: Secondary | ICD-10-CM | POA: Diagnosis not present

## 2017-03-16 DIAGNOSIS — I872 Venous insufficiency (chronic) (peripheral): Secondary | ICD-10-CM | POA: Diagnosis not present

## 2017-03-16 DIAGNOSIS — Z6826 Body mass index (BMI) 26.0-26.9, adult: Secondary | ICD-10-CM | POA: Diagnosis not present

## 2017-03-16 DIAGNOSIS — I10 Essential (primary) hypertension: Secondary | ICD-10-CM | POA: Diagnosis not present

## 2017-03-16 DIAGNOSIS — M48061 Spinal stenosis, lumbar region without neurogenic claudication: Secondary | ICD-10-CM | POA: Diagnosis not present

## 2017-03-17 DIAGNOSIS — M961 Postlaminectomy syndrome, not elsewhere classified: Secondary | ICD-10-CM | POA: Insufficient documentation

## 2017-03-17 DIAGNOSIS — M4316 Spondylolisthesis, lumbar region: Secondary | ICD-10-CM | POA: Diagnosis not present

## 2017-03-17 DIAGNOSIS — Z981 Arthrodesis status: Secondary | ICD-10-CM | POA: Diagnosis not present

## 2017-03-17 DIAGNOSIS — R450 Nervousness: Secondary | ICD-10-CM | POA: Diagnosis not present

## 2017-03-17 DIAGNOSIS — M48062 Spinal stenosis, lumbar region with neurogenic claudication: Secondary | ICD-10-CM | POA: Diagnosis not present

## 2017-03-17 DIAGNOSIS — M48061 Spinal stenosis, lumbar region without neurogenic claudication: Secondary | ICD-10-CM | POA: Diagnosis not present

## 2017-03-23 DIAGNOSIS — M961 Postlaminectomy syndrome, not elsewhere classified: Secondary | ICD-10-CM | POA: Diagnosis not present

## 2017-03-23 DIAGNOSIS — M545 Low back pain: Secondary | ICD-10-CM | POA: Diagnosis not present

## 2017-03-30 DIAGNOSIS — M545 Low back pain: Secondary | ICD-10-CM | POA: Diagnosis not present

## 2017-03-31 DIAGNOSIS — M961 Postlaminectomy syndrome, not elsewhere classified: Secondary | ICD-10-CM | POA: Diagnosis not present

## 2017-03-31 DIAGNOSIS — G894 Chronic pain syndrome: Secondary | ICD-10-CM | POA: Insufficient documentation

## 2017-04-06 ENCOUNTER — Ambulatory Visit: Payer: Medicare Other | Admitting: Nurse Practitioner

## 2017-04-06 ENCOUNTER — Ambulatory Visit: Payer: Medicare Other | Admitting: Obstetrics and Gynecology

## 2017-04-07 DIAGNOSIS — M5136 Other intervertebral disc degeneration, lumbar region: Secondary | ICD-10-CM | POA: Diagnosis not present

## 2017-04-07 DIAGNOSIS — M4316 Spondylolisthesis, lumbar region: Secondary | ICD-10-CM | POA: Diagnosis not present

## 2017-04-13 DIAGNOSIS — Z6826 Body mass index (BMI) 26.0-26.9, adult: Secondary | ICD-10-CM | POA: Diagnosis not present

## 2017-04-13 DIAGNOSIS — I10 Essential (primary) hypertension: Secondary | ICD-10-CM | POA: Diagnosis not present

## 2017-04-13 DIAGNOSIS — M48061 Spinal stenosis, lumbar region without neurogenic claudication: Secondary | ICD-10-CM | POA: Diagnosis not present

## 2017-04-13 DIAGNOSIS — Z01818 Encounter for other preprocedural examination: Secondary | ICD-10-CM | POA: Diagnosis not present

## 2017-04-15 NOTE — Pre-Procedure Instructions (Signed)
Shelia [name]  04/15/2017     No Pharmacies Listed   Your procedure is scheduled on 04-22-2017 Wednesday.  Report to [facility] Admitting at 6:30 A.M.   Call this number if you have problems the morning of surgery:  [phone]   Remember:  Do not eat food or drink liquids after midnight.   Take these medicines the morning of surgery with A SIP OF WATER  Flonase nasal spray if needed Pain medication if needed  STOP ASPIRIN,ANTIINFLAMATORIES (IBUPROFEN,ALEVE,MOTRIN,ADVIL,GOODY'S POWDERS),HERBAL SUPPLEMENTS,FISH OIL,AND VITAMINS 5-7 DAYS PRIOR TO SURGERY    Do not wear jewelry, make-up or nail polish.  Do not wear lotions, powders, or perfumes, or deodorant.  Do not shave 48 hours prior to surgery.   .  Do not bring valuables to the hospital.  [facility] is not responsible for any belongings or valuables.  Contacts, dentures or bridgework may not be worn into surgery.  Leave your suitcase in the car.  After surgery it may be brought to your room.  For patients admitted to the hospital, discharge time will be determined by your treatment team.  Patients discharged the day of surgery will not be allowed to drive home  Special Instructions: [facility] - Preparing for Surgery  Before surgery, you can play an important role.  Because skin is not sterile, your skin needs to be as free of germs as possible.  You can reduce the number of germs on you skin by washing with CHG (chlorahexidine gluconate) soap before surgery.  CHG is an antiseptic cleaner which kills germs and bonds with the skin to continue killing germs even after washing.  Please DO NOT use if you have an allergy to CHG or antibacterial soaps.  If your skin becomes reddened/irritated stop using the CHG and inform your nurse when you arrive at Short Stay.  Do not shave (including legs and underarms) for at least 48 hours prior to the first CHG shower.  You may shave your face.  Please follow these  instructions carefully:   1.  Shower with CHG Soap the night before surgery and the   morning of Surgery.  2.  If you choose to wash your hair, wash your hair first as usual with your normal shampoo.  3.  After you shampoo, rinse your hair and body thoroughly to remove the  Shampoo.  4.  Use CHG as you would any other liquid soap.  You can apply chg directly  to the skin and wash gently with scrungie or a clean washcloth.  5.  Apply the CHG Soap to your body ONLY FROM THE NECK DOWN.   Do not use on open wounds or open sores.  Avoid contact with your eyes,  ears, mouth and genitals (private parts).  Wash genitals (private parts) with your normal soap.  6.  Wash thoroughly, paying special attention to the area where your surgery will be performed.  7.  Thoroughly rinse your body with warm water from the neck down.  8.  DO NOT shower/wash with your normal soap after using and rinsing o  the CHG Soap.  9.  Pat yourself dry with a clean towel.            10.  Wear clean pajamas.            11.  Place clean sheets on your bed the night of your first shower and do not sleep with pets.  Day of Surgery  Do  not apply any lotions/deodorants the morning of surgery.  Please wear clean clothes to the hospital/surgery center.   Please read over the following fact sheets that you were given. MRSA Information and Surgical Site Infection Prevention

## 2017-04-16 ENCOUNTER — Encounter (HOSPITAL_COMMUNITY)
Admission: RE | Admit: 2017-04-16 | Discharge: 2017-04-16 | Disposition: A | Discharge status: Home / Self Care | Payer: Medicare Other | Source: Ambulatory Visit | Attending: Orthopedic Surgery | Admitting: Orthopedic Surgery

## 2017-04-16 ENCOUNTER — Encounter (HOSPITAL_COMMUNITY): Payer: Self-pay

## 2017-04-16 DIAGNOSIS — Z01812 Encounter for preprocedural laboratory examination: Secondary | ICD-10-CM | POA: Insufficient documentation

## 2017-04-16 DIAGNOSIS — M47816 Spondylosis without myelopathy or radiculopathy, lumbar region: Secondary | ICD-10-CM | POA: Diagnosis not present

## 2017-04-16 HISTORY — DX: Anxiety disorder, unspecified: F41.9

## 2017-04-16 LAB — CBC
HCT: 38.4 % (ref 36.0–46.0)
Hemoglobin: 12.5 g/dL (ref 12.0–15.0)
MCH: 28.7 pg (ref 26.0–34.0)
MCHC: 32.6 g/dL (ref 30.0–36.0)
MCV: 88.1 fL (ref 78.0–100.0)
PLATELETS: 327 10*3/uL (ref 150–400)
RBC: 4.36 MIL/uL (ref 3.87–5.11)
RDW: 12.7 % (ref 11.5–15.5)
WBC: 6.7 10*3/uL (ref 4.0–10.5)

## 2017-04-16 LAB — BASIC METABOLIC PANEL
ANION GAP: 13 (ref 5–15)
BUN: 13 mg/dL (ref 6–20)
CALCIUM: 9.8 mg/dL (ref 8.9–10.3)
CO2: 26 mmol/L (ref 22–32)
Chloride: 101 mmol/L (ref 101–111)
Creatinine, Ser: 1.21 mg/dL — ABNORMAL HIGH (ref 0.44–1.00)
GFR calc Af Amer: 51 mL/min — ABNORMAL LOW (ref >60)
GFR, EST NON AFRICAN AMERICAN: 44 mL/min — AB (ref >60)
Glucose, Bld: 101 mg/dL — ABNORMAL HIGH (ref 65–99)
POTASSIUM: 3.3 mmol/L — AB (ref 3.5–5.1)
SODIUM: 140 mmol/L (ref 135–145)

## 2017-04-16 LAB — SURGICAL PCR SCREEN
MRSA, PCR: NEGATIVE
STAPHYLOCOCCUS AUREUS: NEGATIVE

## 2017-04-17 NOTE — H&P (Signed)
Patient ID: Shelia [name] MRN: [id number] DOB/AGE: 05/30/1945 72 y.o.  Admit date: (Not on file)  Admission Diagnoses:  Lumbar Degenerative disc disease  HPI: patient here for H7P XLIF with PSFI L3-4 surgery scheduled for 04/22/2017.   Pt reports overall reports a hx of good health.  Past Medical History: Past Medical History:  Diagnosis Date  . Anxiety   . Cystitis   . Depression   . DJD (degenerative joint disease)   . GERD (gastroesophageal reflux disease)   . Hyperlipidemia   . Hypertension   . Irritable bowel syndrome (IBS)   . PONV (postoperative nausea and vomiting)    with gallbladder surgery 2014  . Spinal stenosis     Surgical History: Past Surgical History:  Procedure Laterality Date  . ABDOMINAL HYSTERECTOMY  1981   TVH  . [employer]   l-5,s-1  . BACK SURGERY  2000   l-5,s-1  . BACK SURGERY  2002   l-5,s-1  . BACK SURGERY  2003   l-5,s-2  . [place]   c-4,c-5  . [place]   c-4,c-5  . CHOLECYSTECTOMY     lap  . COLONOSCOPY  2008   polyps  . hemorrhoid banding  2011  . LUMBAR LAMINECTOMY/DECOMPRESSION MICRODISCECTOMY N/A 09/11/2016   Procedure: Lumbar three-five decompression, insitu fusion Lumbar fhree-five,  removal of lumbar hardware Lumbar five-Sacral one;  Surgeon: Melina Schools, MD;  Location: [place];  Service: Orthopedics;  Laterality: N/A;  3.5 hrs    Family History: Family History  Problem Relation Age of Onset  . Heart disease Mother   . Heart disease Father   . Hemochromatosis Daughter     Social History: Social History   Socioeconomic History  . Marital status: Married    Spouse name: Not on file  . Number of children: Not on file  . Years of education: Not on file  . Highest education level: Not on file  Social Needs  . Financial resource strain: Not on file  . Food insecurity - worry: Not on file  . Food insecurity - inability: Not on file  . Transportation needs  - medical: Not on file  . Transportation needs - non-medical: Not on file  Occupational History  . Not on file  Tobacco Use  . Smoking status: Never Smoker  . Smokeless tobacco: Never Used  Substance and Sexual Activity  . Alcohol use: No  . Drug use: No  . Sexual activity: Yes    Partners: Male    Birth control/protection: Surgical  Other Topics Concern  . Not on file  Social History Narrative  . Not on file    Allergies: No known allergies  Medications: I have reviewed the patient's current medications.  Vital Signs: No data found.  Radiology: No results found.  Labs: Recent Labs    04/16/17 0817  WBC 6.7  RBC 4.36  HCT 38.4  PLT 327   Recent Labs    04/16/17 0817  NA 140  K 3.3*  CL 101  CO2 26  BUN 13  CREATININE 1.21*  GLUCOSE 101*  CALCIUM 9.8   No results for input(s): LABPT, INR in the last 72 hours.  Review of Systems: ROS  Physical Exam: There is no height or weight on file to calculate BMI.  Physical Exam  Constitutional: She is oriented to person, place, and time. She appears well-developed and well-nourished.  HENT:  Head: Normocephalic and atraumatic.  Neck: Normal range of motion. Neck supple.  Cardiovascular: Normal rate and regular rhythm.  Respiratory: Effort normal and breath sounds normal.  GI: Soft. Bowel sounds are normal.  Neurological: She is alert and oriented to person, place, and time.  Skin: Skin is warm and dry.  Psychiatric: She has a normal mood and affect. Her behavior is normal. Judgment and thought content normal.   Severe debilitating back pain radiating into the buttocks and primarily the left lower extremity she does have a foot drop on that side negative Babinski negative Hoffmann's peripheral pulses are 2+ and symmetrical compartments are soft and nontender.    Her lumbar MRI from January 2019 significant for 2 previous laminectomies at L3 4 there is retrolisthesis severe foraminal stenosis and facet  arthrosis grade 1 anterolisthesis at L4-5 with disc degeneration  Assessment and Plan: Risks, benefits of surgery were reviewed with the patient. These include: infection, bleeding, death, stroke, paralysis, ongoing or worse pain, need for additional surgery, injury to the lumbar plexus resulting in hip flexor weakness and difficulty walking without assistive devices. Adjacent segment degenerative disease, need for additional surgery including fusing other levels, leak of spinal fluid, Nonunion, hardware failure, breakage, or mal-position. Deep venous thrombosis (DVT) requiring additional treatment such as filter, and/or medications. Injury to abdominal contents, loss in bowel and bladder control.  Goal of surgery is to reduce (not eliminate) pain, and improve quality of life.  Pt understands to bring her LSO to the hospital for use post op.  All questions were elicited and answered  Ronette Deter, PAC for Melina Schools, MD [facility] [phone]  No change the patient's clinical exam.  She continues to have significant back buttock bilateral neuropathic leg pain right side worse than the left. Risks and benefits of surgery were reviewed with the patient and her husband.  All of their questions were addressed. Plan on moving forward with 2 level interbody fusion with posterior pedicle screw supplementation.

## 2017-04-22 ENCOUNTER — Encounter (HOSPITAL_COMMUNITY): Payer: Self-pay | Admitting: Certified Registered"

## 2017-04-22 ENCOUNTER — Encounter (HOSPITAL_COMMUNITY)
Admission: RE | Disposition: A | Discharge status: Skilled Nursing Facility | Payer: Self-pay | Source: Ambulatory Visit | Attending: Orthopedic Surgery

## 2017-04-22 ENCOUNTER — Inpatient Hospital Stay (HOSPITAL_COMMUNITY)
Admission: RE | Admit: 2017-04-22 | Discharge: 2017-04-27 | DRG: 460 | Disposition: A | Discharge status: Skilled Nursing Facility | Payer: Medicare Other | Source: Ambulatory Visit | Attending: Orthopedic Surgery | Admitting: Orthopedic Surgery

## 2017-04-22 ENCOUNTER — Inpatient Hospital Stay (HOSPITAL_COMMUNITY): Payer: Medicare Other | Admitting: Certified Registered"

## 2017-04-22 ENCOUNTER — Inpatient Hospital Stay (HOSPITAL_COMMUNITY): Payer: Medicare Other

## 2017-04-22 DIAGNOSIS — Z90710 Acquired absence of both cervix and uterus: Secondary | ICD-10-CM | POA: Diagnosis not present

## 2017-04-22 DIAGNOSIS — M4056 Lordosis, unspecified, lumbar region: Secondary | ICD-10-CM | POA: Diagnosis present

## 2017-04-22 DIAGNOSIS — E876 Hypokalemia: Secondary | ICD-10-CM | POA: Diagnosis not present

## 2017-04-22 DIAGNOSIS — Z8249 Family history of ischemic heart disease and other diseases of the circulatory system: Secondary | ICD-10-CM | POA: Diagnosis not present

## 2017-04-22 DIAGNOSIS — Z981 Arthrodesis status: Secondary | ICD-10-CM | POA: Diagnosis not present

## 2017-04-22 DIAGNOSIS — K5900 Constipation, unspecified: Secondary | ICD-10-CM | POA: Diagnosis not present

## 2017-04-22 DIAGNOSIS — M4320 Fusion of spine, site unspecified: Secondary | ICD-10-CM | POA: Diagnosis not present

## 2017-04-22 DIAGNOSIS — R278 Other lack of coordination: Secondary | ICD-10-CM | POA: Diagnosis not present

## 2017-04-22 DIAGNOSIS — M21372 Foot drop, left foot: Secondary | ICD-10-CM | POA: Diagnosis present

## 2017-04-22 DIAGNOSIS — I10 Essential (primary) hypertension: Secondary | ICD-10-CM | POA: Diagnosis present

## 2017-04-22 DIAGNOSIS — M549 Dorsalgia, unspecified: Secondary | ICD-10-CM | POA: Diagnosis not present

## 2017-04-22 DIAGNOSIS — M48061 Spinal stenosis, lumbar region without neurogenic claudication: Secondary | ICD-10-CM | POA: Diagnosis present

## 2017-04-22 DIAGNOSIS — M6281 Muscle weakness (generalized): Secondary | ICD-10-CM | POA: Diagnosis not present

## 2017-04-22 DIAGNOSIS — Z9049 Acquired absence of other specified parts of digestive tract: Secondary | ICD-10-CM

## 2017-04-22 DIAGNOSIS — M48062 Spinal stenosis, lumbar region with neurogenic claudication: Secondary | ICD-10-CM | POA: Diagnosis not present

## 2017-04-22 DIAGNOSIS — D62 Acute posthemorrhagic anemia: Secondary | ICD-10-CM | POA: Diagnosis not present

## 2017-04-22 DIAGNOSIS — M5116 Intervertebral disc disorders with radiculopathy, lumbar region: Principal | ICD-10-CM | POA: Diagnosis present

## 2017-04-22 DIAGNOSIS — M47896 Other spondylosis, lumbar region: Secondary | ICD-10-CM | POA: Diagnosis not present

## 2017-04-22 DIAGNOSIS — Z419 Encounter for procedure for purposes other than remedying health state, unspecified: Secondary | ICD-10-CM

## 2017-04-22 DIAGNOSIS — M961 Postlaminectomy syndrome, not elsewhere classified: Secondary | ICD-10-CM | POA: Diagnosis present

## 2017-04-22 DIAGNOSIS — M4326 Fusion of spine, lumbar region: Secondary | ICD-10-CM | POA: Diagnosis not present

## 2017-04-22 DIAGNOSIS — R488 Other symbolic dysfunctions: Secondary | ICD-10-CM | POA: Diagnosis not present

## 2017-04-22 DIAGNOSIS — Z4889 Encounter for other specified surgical aftercare: Secondary | ICD-10-CM | POA: Diagnosis not present

## 2017-04-22 DIAGNOSIS — M5136 Other intervertebral disc degeneration, lumbar region: Secondary | ICD-10-CM | POA: Diagnosis not present

## 2017-04-22 HISTORY — PX: ANTERIOR LAT LUMBAR FUSION: SHX1168

## 2017-04-22 SURGERY — ANTERIOR LATERAL LUMBAR FUSION 2 LEVELS
Anesthesia: General | Site: Spine Lumbar

## 2017-04-22 MED ORDER — LIDOCAINE HCL (PF) 2 % IJ SOLN
INTRAMUSCULAR | Status: DC | PRN
  Administered 2017-04-22: 100 mg via INTRADERMAL

## 2017-04-22 MED ORDER — BUPIVACAINE-EPINEPHRINE 0.5% -1:200000 IJ SOLN
INTRAMUSCULAR | Status: DC | PRN
  Administered 2017-04-22: 20 mL

## 2017-04-22 MED ORDER — SODIUM CHLORIDE 0.9% FLUSH: 3.0000 mL | INTRAVENOUS | Status: DC | PRN

## 2017-04-22 MED ORDER — THROMBIN (RECOMBINANT) 20000 UNITS EX SOLR
CUTANEOUS | Status: AC
  Filled 2017-04-22: qty 20000

## 2017-04-22 MED ORDER — BACLOFEN 10 MG PO TABS: 10.0000 mg | ORAL_TABLET | Freq: Three times a day (TID) | ORAL | 1 refills | Status: DC

## 2017-04-22 MED ORDER — KETAMINE HCL-SODIUM CHLORIDE 100-0.9 MG/10ML-% IV SOSY
PREFILLED_SYRINGE | INTRAVENOUS | Status: AC
  Filled 2017-04-22: qty 10

## 2017-04-22 MED ORDER — HYDROCHLOROTHIAZIDE 12.5 MG PO CAPS
12.5000 mg | ORAL_CAPSULE | Freq: Every day | ORAL | Status: DC
  Administered 2017-04-25 – 2017-04-27 (×3): 12.5 mg via ORAL
  Filled 2017-04-22 (×3): qty 1

## 2017-04-22 MED ORDER — ACETAMINOPHEN 650 MG RE SUPP: 650.0000 mg | RECTAL | Status: DC | PRN

## 2017-04-22 MED ORDER — CEFAZOLIN SODIUM-DEXTROSE 2-4 GM/100ML-% IV SOLN
2.0000 g | Freq: Three times a day (TID) | INTRAVENOUS | Status: AC
  Administered 2017-04-22 (×2): 2 g via INTRAVENOUS
  Filled 2017-04-22 (×3): qty 100

## 2017-04-22 MED ORDER — PHENYLEPHRINE HCL 10 MG/ML IJ SOLN
INTRAVENOUS | Status: DC | PRN
  Administered 2017-04-22: 20 ug/min via INTRAVENOUS

## 2017-04-22 MED ORDER — SODIUM CHLORIDE 0.9% FLUSH
3.0000 mL | Freq: Two times a day (BID) | INTRAVENOUS | Status: DC
  Administered 2017-04-23 – 2017-04-27 (×9): 3 mL via INTRAVENOUS

## 2017-04-22 MED ORDER — EPHEDRINE 5 MG/ML INJ
INTRAVENOUS | Status: AC
  Filled 2017-04-22: qty 10

## 2017-04-22 MED ORDER — HYDROMORPHONE HCL 1 MG/ML IJ SOLN
INTRAMUSCULAR | Status: AC
  Administered 2017-04-22: 0.5 mg via INTRAVENOUS
  Filled 2017-04-22: qty 1

## 2017-04-22 MED ORDER — PHENOL 1.4 % MT LIQD: 1.0000 | OROMUCOSAL | Status: DC | PRN

## 2017-04-22 MED ORDER — PROPOFOL 10 MG/ML IV BOLUS
INTRAVENOUS | Status: AC
  Filled 2017-04-22: qty 20

## 2017-04-22 MED ORDER — ONDANSETRON HCL 4 MG/2ML IJ SOLN
INTRAMUSCULAR | Status: DC | PRN
  Administered 2017-04-22: 4 mg via INTRAVENOUS

## 2017-04-22 MED ORDER — 0.9 % SODIUM CHLORIDE (POUR BTL) OPTIME
TOPICAL | Status: DC | PRN
  Administered 2017-04-22 (×3): 1000 mL

## 2017-04-22 MED ORDER — OXYCODONE HCL 5 MG PO TABS: 5.0000 mg | ORAL_TABLET | ORAL | Status: DC | PRN

## 2017-04-22 MED ORDER — BUPIVACAINE-EPINEPHRINE (PF) 0.5% -1:200000 IJ SOLN
INTRAMUSCULAR | Status: AC
  Filled 2017-04-22: qty 30

## 2017-04-22 MED ORDER — DEXAMETHASONE SODIUM PHOSPHATE 10 MG/ML IJ SOLN
INTRAMUSCULAR | Status: DC | PRN
  Administered 2017-04-22: 10 mg via INTRAVENOUS

## 2017-04-22 MED ORDER — PROPOFOL 10 MG/ML IV BOLUS
INTRAVENOUS | Status: DC | PRN
  Administered 2017-04-22: 150 mg via INTRAVENOUS

## 2017-04-22 MED ORDER — KETAMINE HCL 10 MG/ML IJ SOLN
INTRAMUSCULAR | Status: DC | PRN
  Administered 2017-04-22 (×2): 10 mg via INTRAVENOUS

## 2017-04-22 MED ORDER — ACETAMINOPHEN 10 MG/ML IV SOLN
INTRAVENOUS | Status: DC | PRN
  Administered 2017-04-22: 1000 mg via INTRAVENOUS

## 2017-04-22 MED ORDER — LACTATED RINGERS IV SOLN
INTRAVENOUS | Status: DC
  Administered 2017-04-22: 17:00:00 via INTRAVENOUS

## 2017-04-22 MED ORDER — METHOCARBAMOL 1000 MG/10ML IJ SOLN
500.0000 mg | Freq: Four times a day (QID) | INTRAMUSCULAR | Status: DC | PRN
  Filled 2017-04-22: qty 5

## 2017-04-22 MED ORDER — MORPHINE SULFATE (PF) 4 MG/ML IV SOLN
1.0000 mg | INTRAVENOUS | Status: DC | PRN
  Administered 2017-04-22: 1 mg via INTRAVENOUS
  Filled 2017-04-22: qty 1

## 2017-04-22 MED ORDER — ONDANSETRON HCL 4 MG/2ML IJ SOLN: 4.0000 mg | Freq: Four times a day (QID) | INTRAMUSCULAR | Status: DC | PRN

## 2017-04-22 MED ORDER — ONDANSETRON HCL 4 MG/2ML IJ SOLN: 4.0000 mg | Freq: Once | INTRAMUSCULAR | Status: DC | PRN

## 2017-04-22 MED ORDER — MIDAZOLAM HCL 5 MG/5ML IJ SOLN
INTRAMUSCULAR | Status: DC | PRN
  Administered 2017-04-22: 2 mg via INTRAVENOUS

## 2017-04-22 MED ORDER — POLYETHYLENE GLYCOL 3350 17 G PO PACK: 17.0000 g | PACK | Freq: Every day | ORAL | Status: DC | PRN

## 2017-04-22 MED ORDER — TRAZODONE HCL 50 MG PO TABS
50.0000 mg | ORAL_TABLET | Freq: Every evening | ORAL | Status: DC | PRN
  Administered 2017-04-23 – 2017-04-26 (×4): 50 mg via ORAL
  Filled 2017-04-22 (×5): qty 1

## 2017-04-22 MED ORDER — HEMOSTATIC AGENTS (NO CHARGE) OPTIME
TOPICAL | Status: DC | PRN
  Administered 2017-04-22: 1
  Administered 2017-04-22: 1 via TOPICAL

## 2017-04-22 MED ORDER — OXYCODONE HCL 5 MG PO TABS
10.0000 mg | ORAL_TABLET | ORAL | Status: DC | PRN
  Administered 2017-04-22 – 2017-04-27 (×24): 10 mg via ORAL
  Filled 2017-04-22 (×24): qty 2

## 2017-04-22 MED ORDER — ACETAMINOPHEN 325 MG PO TABS
650.0000 mg | ORAL_TABLET | ORAL | Status: DC | PRN
  Administered 2017-04-23 – 2017-04-24 (×3): 650 mg via ORAL
  Filled 2017-04-22 (×3): qty 2

## 2017-04-22 MED ORDER — SUCCINYLCHOLINE CHLORIDE 200 MG/10ML IV SOSY: PREFILLED_SYRINGE | INTRAVENOUS | Status: DC | PRN

## 2017-04-22 MED ORDER — MIDAZOLAM HCL 2 MG/2ML IJ SOLN
INTRAMUSCULAR | Status: AC
  Filled 2017-04-22: qty 2

## 2017-04-22 MED ORDER — ONDANSETRON 4 MG PO TBDP: 4.0000 mg | ORAL_TABLET | Freq: Three times a day (TID) | ORAL | 0 refills | Status: DC | PRN

## 2017-04-22 MED ORDER — PROPOFOL 500 MG/50ML IV EMUL
INTRAVENOUS | Status: DC | PRN
  Administered 2017-04-22: 25 ug/kg/min via INTRAVENOUS

## 2017-04-22 MED ORDER — MAGNESIUM CITRATE PO SOLN: 1.0000 | Freq: Once | ORAL | Status: DC | PRN

## 2017-04-22 MED ORDER — METHOCARBAMOL 500 MG PO TABS
500.0000 mg | ORAL_TABLET | Freq: Four times a day (QID) | ORAL | Status: DC | PRN
  Administered 2017-04-22 – 2017-04-27 (×9): 500 mg via ORAL
  Filled 2017-04-22 (×9): qty 1

## 2017-04-22 MED ORDER — FENTANYL CITRATE (PF) 250 MCG/5ML IJ SOLN
INTRAMUSCULAR | Status: AC
  Filled 2017-04-22: qty 5

## 2017-04-22 MED ORDER — HYDROMORPHONE HCL 1 MG/ML IJ SOLN
0.2500 mg | INTRAMUSCULAR | Status: DC | PRN
  Administered 2017-04-22 (×2): 0.5 mg via INTRAVENOUS

## 2017-04-22 MED ORDER — MENTHOL 3 MG MT LOZG: 1.0000 | LOZENGE | OROMUCOSAL | Status: DC | PRN

## 2017-04-22 MED ORDER — DEXAMETHASONE SODIUM PHOSPHATE 10 MG/ML IJ SOLN
INTRAMUSCULAR | Status: AC
  Filled 2017-04-22: qty 1

## 2017-04-22 MED ORDER — PHENYLEPHRINE HCL 10 MG/ML IJ SOLN
INTRAMUSCULAR | Status: DC | PRN
  Administered 2017-04-22: 80 ug via INTRAVENOUS

## 2017-04-22 MED ORDER — LOSARTAN POTASSIUM 50 MG PO TABS
100.0000 mg | ORAL_TABLET | Freq: Every day | ORAL | Status: DC
  Administered 2017-04-25 – 2017-04-27 (×3): 100 mg via ORAL
  Filled 2017-04-22 (×3): qty 2

## 2017-04-22 MED ORDER — ARTIFICIAL TEARS OPHTHALMIC OINT
TOPICAL_OINTMENT | OPHTHALMIC | Status: DC | PRN
  Administered 2017-04-22: 1 via OPHTHALMIC

## 2017-04-22 MED ORDER — LIDOCAINE HCL (PF) 2 % IJ SOLN: INTRAMUSCULAR | Status: DC | PRN

## 2017-04-22 MED ORDER — CEFAZOLIN SODIUM-DEXTROSE 2-4 GM/100ML-% IV SOLN
2.0000 g | INTRAVENOUS | Status: AC
  Administered 2017-04-22 (×2): 2 g via INTRAVENOUS

## 2017-04-22 MED ORDER — PROPOFOL 10 MG/ML IV BOLUS: INTRAVENOUS | Status: DC | PRN

## 2017-04-22 MED ORDER — CEFAZOLIN SODIUM-DEXTROSE 2-4 GM/100ML-% IV SOLN
INTRAVENOUS | Status: AC
  Filled 2017-04-22: qty 100

## 2017-04-22 MED ORDER — FLUTICASONE PROPIONATE 50 MCG/ACT NA SUSP
2.0000 | Freq: Every day | NASAL | Status: DC | PRN
  Filled 2017-04-22: qty 16

## 2017-04-22 MED ORDER — GLYCOPYRROLATE 0.2 MG/ML IJ SOLN
INTRAMUSCULAR | Status: DC | PRN
  Administered 2017-04-22: 0.1 mg via INTRAVENOUS

## 2017-04-22 MED ORDER — THROMBIN (RECOMBINANT) 20000 UNITS EX SOLR
CUTANEOUS | Status: DC | PRN
  Administered 2017-04-22: 20000 [IU] via TOPICAL

## 2017-04-22 MED ORDER — MEPERIDINE HCL 50 MG/ML IJ SOLN: 6.2500 mg | INTRAMUSCULAR | Status: DC | PRN

## 2017-04-22 MED ORDER — HYDROMORPHONE HCL 1 MG/ML IJ SOLN
INTRAMUSCULAR | Status: AC
  Filled 2017-04-22: qty 0.5

## 2017-04-22 MED ORDER — ARTIFICIAL TEARS OPHTHALMIC OINT
TOPICAL_OINTMENT | OPHTHALMIC | Status: AC
  Filled 2017-04-22: qty 3.5

## 2017-04-22 MED ORDER — ONDANSETRON HCL 4 MG PO TABS: 4.0000 mg | ORAL_TABLET | Freq: Four times a day (QID) | ORAL | Status: DC | PRN

## 2017-04-22 MED ORDER — LACTATED RINGERS IV SOLN
INTRAVENOUS | Status: DC | PRN
  Administered 2017-04-22: 09:00:00 via INTRAVENOUS

## 2017-04-22 MED ORDER — LACTATED RINGERS IV SOLN
INTRAVENOUS | Status: DC | PRN
  Administered 2017-04-22: 08:00:00 via INTRAVENOUS

## 2017-04-22 MED ORDER — DOCUSATE SODIUM 100 MG PO CAPS
100.0000 mg | ORAL_CAPSULE | Freq: Two times a day (BID) | ORAL | Status: DC
  Administered 2017-04-22 – 2017-04-27 (×10): 100 mg via ORAL
  Filled 2017-04-22 (×10): qty 1

## 2017-04-22 MED ORDER — PHENYLEPHRINE 40 MCG/ML (10ML) SYRINGE FOR IV PUSH (FOR BLOOD PRESSURE SUPPORT)
PREFILLED_SYRINGE | INTRAVENOUS | Status: AC
  Filled 2017-04-22: qty 10

## 2017-04-22 MED ORDER — SUCCINYLCHOLINE CHLORIDE 200 MG/10ML IV SOSY
PREFILLED_SYRINGE | INTRAVENOUS | Status: DC | PRN
  Administered 2017-04-22: 140 mg via INTRAVENOUS

## 2017-04-22 MED ORDER — OXYCODONE-ACETAMINOPHEN 10-325 MG PO TABS: 1.0000 | ORAL_TABLET | ORAL | 0 refills | Status: DC | PRN

## 2017-04-22 MED ORDER — ACETAMINOPHEN 10 MG/ML IV SOLN
INTRAVENOUS | Status: AC
  Filled 2017-04-22: qty 100

## 2017-04-22 MED ORDER — ONDANSETRON HCL 4 MG/2ML IJ SOLN
INTRAMUSCULAR | Status: AC
  Filled 2017-04-22: qty 2

## 2017-04-22 MED ORDER — HYDROMORPHONE HCL 1 MG/ML IJ SOLN
INTRAMUSCULAR | Status: DC | PRN
  Administered 2017-04-22: 0.5 mg via INTRAVENOUS

## 2017-04-22 MED ORDER — EPHEDRINE SULFATE 50 MG/ML IJ SOLN
INTRAMUSCULAR | Status: DC | PRN
  Administered 2017-04-22: 10 mg via INTRAVENOUS
  Administered 2017-04-22 (×2): 5 mg via INTRAVENOUS

## 2017-04-22 MED ORDER — FENTANYL CITRATE (PF) 100 MCG/2ML IJ SOLN
INTRAMUSCULAR | Status: DC | PRN
  Administered 2017-04-22: 75 ug via INTRAVENOUS
  Administered 2017-04-22: 100 ug via INTRAVENOUS
  Administered 2017-04-22 (×2): 50 ug via INTRAVENOUS
  Administered 2017-04-22: 150 ug via INTRAVENOUS
  Administered 2017-04-22: 50 ug via INTRAVENOUS
  Administered 2017-04-22: 25 ug via INTRAVENOUS

## 2017-04-22 SURGICAL SUPPLY — 97 items
ADH SKN CLS APL DERMABOND .7 (GAUZE/BANDAGES/DRESSINGS) ×1
APPLIER CLIP 11 MED OPEN (CLIP)
APR CLP MED 11 20 MLT OPN (CLIP)
ATTRACTOMAT 16X20 MAGNETIC DRP (DRAPES) ×3 IMPLANT
BLADE CLIPPER SURG (BLADE) IMPLANT
BLADE SURG 10 STRL SS (BLADE) ×6 IMPLANT
BONE MATRIX OSTEOCEL PRO MED (Bone Implant) ×6 IMPLANT
BONE VIVIGEN FORMABLE 5.4CC (Bone Implant) ×3 IMPLANT
CAGE COROENT XL 14X18X55-10 (Cage) ×4 IMPLANT
CAGE COROENT XL 14X18X55MM-10 (Cage) ×2 IMPLANT
CLIP APPLIE 11 MED OPEN (CLIP) IMPLANT
CLOSURE STERI-STRIP 1/2X4 (GAUZE/BANDAGES/DRESSINGS) ×2
CLOSURE WOUND 1/2 X4 (GAUZE/BANDAGES/DRESSINGS)
CLSR STERI-STRIP ANTIMIC 1/2X4 (GAUZE/BANDAGES/DRESSINGS) ×4 IMPLANT
CORDS BIPOLAR (ELECTRODE) ×3 IMPLANT
COVER SURGICAL LIGHT HANDLE (MISCELLANEOUS) ×3 IMPLANT
DERMABOND ADVANCED (GAUZE/BANDAGES/DRESSINGS) ×2
DERMABOND ADVANCED .7 DNX12 (GAUZE/BANDAGES/DRESSINGS) ×1 IMPLANT
DRAPE C-ARM 42X72 X-RAY (DRAPES) ×3 IMPLANT
DRAPE C-ARMOR (DRAPES) ×3 IMPLANT
DRAPE ORTHO SPLIT 77X108 STRL (DRAPES) ×3
DRAPE POUCH INSTRU U-SHP 10X18 (DRAPES) ×3 IMPLANT
DRAPE SURG 17X23 STRL (DRAPES) ×3 IMPLANT
DRAPE SURG ORHT 6 SPLT 77X108 (DRAPES) ×1 IMPLANT
DRAPE U-SHAPE 47X51 STRL (DRAPES) ×3 IMPLANT
DRSG AQUACEL AG ADV 3.5X10 (GAUZE/BANDAGES/DRESSINGS) IMPLANT
DRSG OPSITE POSTOP 4X6 (GAUZE/BANDAGES/DRESSINGS) ×6 IMPLANT
DRSG OPSITE POSTOP 4X8 (GAUZE/BANDAGES/DRESSINGS) ×3 IMPLANT
DURAPREP 26ML APPLICATOR (WOUND CARE) ×6 IMPLANT
ELECT BLADE 4.0 EZ CLEAN MEGAD (MISCELLANEOUS) ×3
ELECT CAUTERY BLADE 6.4 (BLADE) ×3 IMPLANT
ELECT PENCIL ROCKER SW 15FT (MISCELLANEOUS) ×3 IMPLANT
ELECT REM PT RETURN 9FT ADLT (ELECTROSURGICAL) ×3
ELECTRODE BLDE 4.0 EZ CLN MEGD (MISCELLANEOUS) ×1 IMPLANT
ELECTRODE REM PT RTRN 9FT ADLT (ELECTROSURGICAL) ×1 IMPLANT
FLOSEAL 10ML (HEMOSTASIS) ×3 IMPLANT
GAUZE SPONGE 4X4 16PLY XRAY LF (GAUZE/BANDAGES/DRESSINGS) ×3 IMPLANT
GLOVE BIO SURGEON STRL SZ 6.5 (GLOVE) ×4 IMPLANT
GLOVE BIO SURGEONS STRL SZ 6.5 (GLOVE) ×2
GLOVE BIOGEL PI IND STRL 6.5 (GLOVE) ×1 IMPLANT
GLOVE BIOGEL PI IND STRL 8.5 (GLOVE) ×1 IMPLANT
GLOVE BIOGEL PI INDICATOR 6.5 (GLOVE) ×2
GLOVE BIOGEL PI INDICATOR 8.5 (GLOVE) ×2
GLOVE SS BIOGEL STRL SZ 8.5 (GLOVE) ×2 IMPLANT
GLOVE SUPERSENSE BIOGEL SZ 8.5 (GLOVE) ×4
GLOVE SURG SS PI 6.5 STRL IVOR (GLOVE) ×3 IMPLANT
GLOVE SURG SS PI 7.0 STRL IVOR (GLOVE) ×3 IMPLANT
GOWN STRL REUS W/ TWL LRG LVL3 (GOWN DISPOSABLE) ×2 IMPLANT
GOWN STRL REUS W/TWL 2XL LVL3 (GOWN DISPOSABLE) ×3 IMPLANT
GOWN STRL REUS W/TWL LRG LVL3 (GOWN DISPOSABLE) ×6
GUIDEWIRE NITINOL BEVEL TIP (WIRE) ×18 IMPLANT
KIT BASIN OR (CUSTOM PROCEDURE TRAY) ×3 IMPLANT
KIT DILATOR XLIF 5 (KITS) ×2 IMPLANT
KIT ROOM TURNOVER OR (KITS) ×3 IMPLANT
KIT SURGICAL ACCESS MAXCESS 4 (KITS) ×6 IMPLANT
KIT XLIF (KITS) ×1
MODULE EMG NEEDLE SSEP NVM5 (NEEDLE) ×3 IMPLANT
MODULE NVM5 NEXT GEN EMG (NEEDLE) ×3 IMPLANT
NEEDLE I-PASS III (NEEDLE) ×3 IMPLANT
NEEDLE SPNL 18GX3.5 QUINCKE PK (NEEDLE) ×3 IMPLANT
NS IRRIG 1000ML POUR BTL (IV SOLUTION) ×9 IMPLANT
PACK LAMINECTOMY ORTHO (CUSTOM PROCEDURE TRAY) ×3 IMPLANT
PACK UNIVERSAL I (CUSTOM PROCEDURE TRAY) ×3 IMPLANT
PAD ARMBOARD 7.5X6 YLW CONV (MISCELLANEOUS) ×9 IMPLANT
ROD RELINE TI MAS LD 5.5X65 (Rod) ×6 IMPLANT
SCREW LOCK RELINE 5.5 TULIP (Screw) ×18 IMPLANT
SCREW RED RELINE 7.5X45MM POLY (Screw) ×3 IMPLANT
SCREW RELINE MAS RED 7.5X50MM (Screw) ×3 IMPLANT
SCREW RELINE RED 6.5X45MM POLY (Screw) ×9 IMPLANT
SCREW RELINE RED 6.5X50MM POLY (Screw) ×3 IMPLANT
SPONGE INTESTINAL PEANUT (DISPOSABLE) ×6 IMPLANT
SPONGE LAP 18X18 X RAY DECT (DISPOSABLE) ×3 IMPLANT
SPONGE LAP 4X18 X RAY DECT (DISPOSABLE) ×3 IMPLANT
SPONGE SURGIFOAM ABS GEL 100 (HEMOSTASIS) ×3 IMPLANT
STAPLER VISISTAT (STAPLE) ×3 IMPLANT
STRIP CLOSURE SKIN 1/2X4 (GAUZE/BANDAGES/DRESSINGS) IMPLANT
SURGIFLO W/THROMBIN 8M KIT (HEMOSTASIS) IMPLANT
SUT BONE WAX W31G (SUTURE) ×3 IMPLANT
SUT MNCRL AB 3-0 PS2 18 (SUTURE) ×3 IMPLANT
SUT MON AB 3-0 SH 27 (SUTURE) ×9
SUT MON AB 3-0 SH27 (SUTURE) ×3 IMPLANT
SUT PROLENE 5 0 C 1 24 (SUTURE) IMPLANT
SUT SILK 2 0 TIES 10X30 (SUTURE) ×3 IMPLANT
SUT SILK 3 0 TIES 10X30 (SUTURE) ×3 IMPLANT
SUT VIC AB 1 CT1 18XCR BRD 8 (SUTURE) ×3 IMPLANT
SUT VIC AB 1 CT1 27 (SUTURE) ×6
SUT VIC AB 1 CT1 27XBRD ANBCTR (SUTURE) ×2 IMPLANT
SUT VIC AB 1 CT1 8-18 (SUTURE) ×9
SUT VIC AB 1 CTX 36 (SUTURE) ×6
SUT VIC AB 1 CTX36XBRD ANBCTR (SUTURE) ×2 IMPLANT
SUT VIC AB 2-0 CT1 18 (SUTURE) ×12 IMPLANT
SYR BULB IRRIGATION 50ML (SYRINGE) ×3 IMPLANT
TOWEL OR 17X24 6PK STRL BLUE (TOWEL DISPOSABLE) ×3 IMPLANT
TOWEL OR 17X26 10 PK STRL BLUE (TOWEL DISPOSABLE) ×3 IMPLANT
TRAY FOLEY CATH SILVER 16FR (SET/KITS/TRAYS/PACK) ×3 IMPLANT
WATER STERILE IRR 1000ML POUR (IV SOLUTION) IMPLANT
YANKAUER SUCT BULB TIP NO VENT (SUCTIONS) ×3 IMPLANT

## 2017-04-22 NOTE — Op Note (Signed)
Operative report.  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  Anterior listhesis L3-4 and L4-5.  Postoperative diagnosis: Same.  Operative procedure: Lateral interbody fusion L3-4 and L4-5.  Posterior pedicle screw fixation (segmental) L3-L5.  First Assistant: Ronette Deter, [place].  Implant system:  1. Nuvasive XLIF PEEK cage:  14x18x55  with10 degree lordosis at both levels    2. Nuvasive Pedicle screws:  L3: 6.5 x 72mm.   L4:  R: 6.5x45.  L: 6.5x50.  L5: R: 7.5x45  L: 7.5x50 Allograft:  vivogen and osteocel  Intraoperative neuro monitoring: No abnormal free running EMG activity throughout the case was noted.  All pedicle screws were individually stimulated and there was no adverse activity at greater than 40 mA.  No evidence of lumbar plexus disruption or compression.  Complications: None  Indications: This is a very pleasant active 72 year old woman approximately 9 months ago underwent a posterior L3-L5 decompression but then developed severe back buttock and radicular leg pain right worse than the left.  Attempts at conservative management consisting of injections, physiotherapy, medications, and activity restrictions ultimately failed to produce any significant improvement.  Imaging studies confirmed the anterior listhesis as well as current stenosis especially right foraminal stenosis at the involved levels.  After a long discussion about treatment options patient elected to proceed with a revision fusion.  All appropriate risks benefits and alternatives were discussed with the patient consent was obtained.  Operative report: The patient was brought the operating room placed upon the operating room table.  After successful induction of general anesthesia and endotracheally patient teds SCDs and a Foley were inserted.  The neuro monitoring rep then applied all of the appropriate leads and the patient was turned into lateral decubitus position (right  side up).  Axillary roll was patent placed padded.  Holder patient was secured directly to the table with tape.  I confirmed utilization of the L3-4 and L4-5 discs in both planes prior to securing the patient to the table.  Done so the patient was perpendicular bed once I confirmed satisfactory positioning of the flank and back were prepped and draped in a standard fashion.  Timeout was taken to confirm patient procedure and all other important data.  I marked out the superior portion of the L4-5 disc space area with quarter percent Marcaine with epi.  Incision was made and I sharply dissected down to the fascia of the external oblique.  A secondary incision was made 1 fingerbreadth posteriorly and I bluntly dissected down to the retroperitoneal fascia.  I bluntly entered into the retroperitoneal fascia with my finger and then began bluntly dissecting the tissue in the in the retroperitoneal fascia once I had mobilized this adipose tissue I then dissected from the undersurface of the external oblique into my lateral incision.  I then placed the first rating portal down onto the surface of the psoas muscle.  I directly stimulated the psoas muscle to ensure that where I wanted to enter from a trans-psoas approach that the plexus would not be traumatized.  Once I confirmed I was not causing any trauma to the plexus I went through the psoas and then placed the guidepin through this dilating portal into the disc I then stimulated behind the each of the blades sure that plexus was not plexus.  I then sequentially dilated up stimulating each time circumferentially until I placed the working trocar trocar.  I then gently repositioned the blade posteriorly so as to have maximum exposure of  the lateral surface of the disc space.  After stimulating behind the blade plexus was not under direct compression I secured the posterior blade directly to the disc space with the shim.  So an adequate fixationI then placed the shim  through this.  Directly to the disc space.  At this point had excellent visualization of the L4-5 disc space.  An annulotomy was performed with a 10 blade scalpel and then using pituitary rongeurs Cobb elevators and curettes I removed all of the disc material and release the contralateral annulus.  At this point I had an excellent discectomy.  I rasped the endplates so I had bleeding subchondral bone.  I then trialed with sequential dilators until I elected to use the size 14 x 18 lordotic peek cage.  This cage was packed with the allograft bone and malleted to the appropriate depth.  I had excellent apposition of both endplates and restoration of intervertebral disc space height indirect foraminal decompression.  L4-5 intervertebral cage properly positioned I then repositioned my retractor to expose the L3-4 disc space.  Using the exact same technique I had utilized at L4-5 I exposed the L3-4 disc space.  Again stimulating circumferentially while I was dilating to confirm there was no trauma to the lumbar plexus I expanded my trans-psoas approach at this level and final retractor was secured in addition to appropriately.  Once I confirmed that the plexus was not traumatized by stimulating circumferentially behind each of the blades cured the bleed directly to the disc space.  I then utilized a Cobb elevators, curettes and pituitary rongeurs to effectively remove the disc and release the contralateral again the endplates were rasped and trial spacers until I elected to use the same 14 x 18 x 55 peek cage.  This was also malleted to the appropriate depth.  Once it was placed I did notice there was a slight indentation in the endplate of L4, but the overall alignment and position was acceptable.  At this point I irrigated the wound copiously normal saline and ensured hemostasis using bipolar electrocautery.  I removed all the retracting devices and final x-rays were taken in the overall alignment and position of  both cages were satisfactory.  I then closed the fascia of the external oblique with interrupted #1 Vicryl sutures superficial 2-0 Vicryl suture and a 3-0 Monocryl.  The small incision which I made posterior was then closed in a similar fashion interrupted #1 Vicryl suture, 2-0 Vicryl suture, with 3-0 Monocryl.   At this point with the patient still in the lateral decubitus position I identified the L3-L4 and L5 pedicles in both the AP and lateral planes.  At this point rather than repositioning the patient elected to place the pedicle screws percutaneously with the patient in the lateral decubitus position.  A small incision was made on the lateral aspect of the 3 pedicle and advanced the Jamshidi needle down to the lateral aspect of the pedicle just at the junction with the transverse process.  Satisfactory position in both the AP and lateral planes.  I then connected the Jamshidi needle to the neurostimulator and using fluoroscopy and direct neural stimulation advanced the Jamshidi needle into the L3 pedicle.  Once I was nearing the medial border of the pedicle confirmed on the lateral view that tip of the Jamshidi needle was just at or beyond the posterior wall of the vertebral body.  At this point I was pleased with the overall trajectory and so I advanced the Jamshidi  needle into the vertebral body of L3.  The guidepin was then placed through the Jamshidi needle to completely cannulate the L3 pedicle.  In this exact same technique the L4 and L5 pedicles were cannulated.  I then repeated this on the contralateral side so I could obtain bilateral pedicle screw fixation at L3, L4, and L5.  At this point with all 6 pedicles cannulated I confirmed satisfactory trajectory position of the guidepins in both the AP and lateral planes.  Once this was done I obtain the appropriate size pedicle screw placed over the guidepin and then advanced it over the guidepin into the pedicle.  Again I was stimulating the insertion  device.  Once the pedicle screw was beyond the posterior wall of the vertebral body I removed the cannula and advanced the pedicle screw to its final position.  I repeated this exact same technique at each level.  Once all 6 pedicle screws were placed I then directly stimulated each pedicle to confirm there was no adverse EMG activity to suggest breach of the pedicle.  With all pedicle screws failing to see because EMG activity at greater than 40 mA I was satisfied.  X-rays AP and lateral confirmed satisfactory trajectory and positioning of each of the pedicle screws.  At this point I then obtained the rod and placed it down the pedicle screw construct.  I then placed the locking knots.  Through rod complex.  All 6 locking nuts were torqued according manufacturer standards.  The insertional blades were removed and the  2 posterior wounds irrigated copiously normal I closed them in a layered fashion with interrupted #1 Vicryl suture, 2-0 Vicryl suture, and a 3-0 Monocryl.  Final x-rays were taken which demonstrated satisfactory position of the intervertebral cages as well as the posterior pedicle screw construct.  At this point Steri-Strips dry dressings were applied patient was ultimately extubated and transferred the PACU without incident.  The end of the case all needle sponge counts were correct.  There were no adverse intraoperative events.

## 2017-04-22 NOTE — Transfer of Care (Signed)
Immediate Anesthesia Transfer of Care Note  Patient: Shelia [name]  Procedure(s) Performed: ANTERIOR LATERAL LUMBAR FUSION (XLIF) with posterior spinal fusion interbody L3-5 (N/A Spine Lumbar)  Patient Location: PACU  Anesthesia Type:General  Level of Consciousness: awake, oriented, drowsy and patient cooperative  Airway & Oxygen Therapy: Patient Spontanous Breathing and Patient connected to face mask oxygen  Post-op Assessment: Report given to RN and Post -op Vital signs reviewed and stable  Post vital signs: Reviewed and stable  Last Vitals:  Vitals:   04/22/17 0656  BP: (!) 159/72  Pulse: 78  Resp: 20  Temp: 37.2 C  SpO2: 99%    Last Pain:  Vitals:   04/22/17 0723  TempSrc:   PainSc: 8          Complications: No apparent anesthesia complications

## 2017-04-22 NOTE — Brief Op Note (Signed)
04/22/2017  1:52 PM  PATIENT:  Shelia [name]  72 y.o. 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

## 2017-04-22 NOTE — Discharge Instructions (Signed)
Spinal Fusion, Care After °These instructions give you information about caring for yourself after your procedure. Your doctor may also give you more specific instructions. Call your doctor if you have any problems or questions after your procedure. °Follow these instructions at home: °Medicines °· Take over-the-counter and prescription medicines only as told by your doctor. These include any medicines for pain. °· Do not drive for 24 hours if you received a sedative. °· Do not drive or use heavy machinery while taking prescription pain medicine. °· If you were prescribed an antibiotic medicine, take it as told by your doctor. Do not stop taking the antibiotic even if you start to feel better. °Surgical Cut (Incision) Care °· Follow instructions from your doctor about how to take care of your surgical cut. Make sure you: °? Wash your hands with soap and water before you change your bandage (dressing). If you cannot use soap and water, use hand sanitizer. °? Change your bandage as told by your doctor. °? Leave stitches (sutures), skin glue, or skin tape (adhesive) strips in place. They may need to stay in place for 2 weeks or longer. If tape strips get loose and curl up, you may trim the loose edges. Do not remove tape strips completely unless your doctor says it is okay. °· Keep your surgical cut clean and dry. Do not take baths, swim, or use a hot tub until your doctor says it is okay. °· Check your surgical cut and the area around it every day for: °? Redness. °? Swelling. °? Fluid. °Physical Activity °· Return to your normal activities as told by your doctor. Ask your doctor what activities are safe for you. Rest and protect your back as much as you can. °· Follow instructions from your doctor about how to move. Use good posture to help your spine heal. °· Do not lift anything that is heavier than 8 lb (3.6 kg) or as told by your doctor until he or she says that it is safe. Do not lift anything over your  head. °· Do not twist or bend at the waist until your doctor says it is okay. °· Avoid pushing or pulling motions. °· Do not sit or lie down in the same position for long periods of time. °· Do not start to exercise until your doctor says it is okay. Ask your doctor what kinds of exercise you can do to make your back stronger. °General instructions °· If you were given a brace, use it as told by your doctor. °· Wear compression stockings as told by your doctor. °· Do not use tobacco products. These include cigarettes, chewing tobacco, or e-cigarettes. If you need help quitting, ask your doctor. °· Keep all follow-up visits as told by your doctor. This is important. This includes any visits with your physical therapist, if this applies. °Contact a doctor if: °· Your pain gets worse. °· Your medicine does not help your pain. °· Your legs or feet become painful or swollen. °· Your surgical cut is red, swollen, or painful. °· You have fluid, blood, or pus coming from your surgical cut. °· You feel sick to your stomach (nauseous). °· You throw up (vomit). °· Your have weakness or loss of feeling (numbness) in your legs that is new or getting worse. °· You have a fever. °· You have trouble controlling when you pee (urinate) or poop (have a bowel movement). °Get help right away if: °· Your pain is very bad. °· You have   chest pain. °· You have trouble breathing. °· You start to have a cough. °These symptoms may be an emergency. Do not wait to see if the symptoms will go away. Get medical help right away. Call your local emergency services (911 in the [place] Do not drive yourself to the hospital. °This information is not intended to replace advice given to you by your health care provider. Make sure you discuss any questions you have with your health care provider. °Document Released: 06/13/2010 Document Revised: 10/16/2015 Document Reviewed: 08/02/2014 °Elsevier Interactive Patient Education © 2018 [employer] ° °

## 2017-04-22 NOTE — Anesthesia Procedure Notes (Signed)
Procedure Name: Intubation Date/Time: 04/22/2017 8:35 AM Performed by: Adalberto Ill, CRNA Pre-anesthesia Checklist: Patient identified, Suction available, Emergency Drugs available, Patient being monitored and Timeout performed Patient Re-evaluated:Patient Re-evaluated prior to induction Oxygen Delivery Method: Circle system utilized Preoxygenation: Pre-oxygenation with 100% oxygen Induction Type: IV induction Ventilation: Mask ventilation without difficulty Laryngoscope Size: Mac and 3 Grade View: Grade I Tube type: Oral Number of attempts: 1 Airway Equipment and Method: Stylet Placement Confirmation: ETT inserted through vocal cords under direct vision,  breath sounds checked- equal and bilateral and positive ETCO2 Secured at: 21 cm Tube secured with: Tape Dental Injury: Teeth and Oropharynx as per pre-operative assessment

## 2017-04-22 NOTE — Anesthesia Postprocedure Evaluation (Signed)
Anesthesia Post Note  Patient: UBAH RADKE  Procedure(s) Performed: ANTERIOR LATERAL LUMBAR FUSION (XLIF) with posterior spinal fusion interbody L3-5 (N/A Spine Lumbar)     Patient location during evaluation: PACU Anesthesia Type: General Level of consciousness: awake and alert Pain management: pain level controlled Vital Signs Assessment: post-procedure vital signs reviewed and stable Respiratory status: spontaneous breathing, nonlabored ventilation, respiratory function stable and patient connected to nasal cannula oxygen Cardiovascular status: blood pressure returned to baseline and stable Postop Assessment: no apparent nausea or vomiting Anesthetic complications: no    Last Vitals:  Vitals:   04/22/17 0656 04/22/17 1427  BP: (!) 159/72 105/68  Pulse: 78 83  Resp: 20 15  Temp: 37.2 C 36.7 C  SpO2: 99% 100%    Last Pain:  Vitals:   04/22/17 0723  TempSrc:   PainSc: 8     LLE Motor Response: Purposeful movement;Responds to commands (04/22/17 1427) LLE Sensation: Full sensation (04/22/17 1427) RLE Motor Response: Purposeful movement;Responds to commands (04/22/17 1427) RLE Sensation: Full sensation (04/22/17 1427)      Macallister Ashmead DAVID

## 2017-04-22 NOTE — Anesthesia Preprocedure Evaluation (Signed)
Anesthesia Evaluation  Patient identified by MRN, date of birth, ID band Patient awake    Reviewed: Allergy & Precautions, NPO status , Patient's Chart, lab work & pertinent test results  History of Anesthesia Complications (+) PONV  Airway Mallampati: I  TM Distance: >3 FB Neck ROM: Full    Dental   Pulmonary    Pulmonary exam normal        Cardiovascular hypertension, Pt. on medications Normal cardiovascular exam     Neuro/Psych Anxiety Depression    GI/Hepatic GERD  Medicated and Controlled,  Endo/Other    Renal/GU      Musculoskeletal   Abdominal   Peds  Hematology   Anesthesia Other Findings   Reproductive/Obstetrics                             Anesthesia Physical Anesthesia Plan  ASA: II  Anesthesia Plan: General   Post-op Pain Management:    Induction:   PONV Risk Score and Plan: 3 and Ondansetron, Dexamethasone and Midazolam  Airway Management Planned:   Additional Equipment:   Intra-op Plan:   Post-operative Plan: Extubation in OR  Informed Consent: I have reviewed the patients History and Physical, chart, labs and discussed the procedure including the risks, benefits and alternatives for the proposed anesthesia with the patient or authorized representative who has indicated his/her understanding and acceptance.     Plan Discussed with: CRNA and Surgeon  Anesthesia Plan Comments:         Anesthesia Quick Evaluation

## 2017-04-23 ENCOUNTER — Inpatient Hospital Stay (HOSPITAL_COMMUNITY): Payer: Medicare Other

## 2017-04-23 ENCOUNTER — Encounter (HOSPITAL_COMMUNITY): Payer: Self-pay | Admitting: Orthopedic Surgery

## 2017-04-23 ENCOUNTER — Other Ambulatory Visit: Payer: Self-pay

## 2017-04-23 LAB — CBC
HEMATOCRIT: 27.5 % — AB (ref 36.0–46.0)
HEMOGLOBIN: 8.8 g/dL — AB (ref 12.0–15.0)
MCH: 28 pg (ref 26.0–34.0)
MCHC: 32 g/dL (ref 30.0–36.0)
MCV: 87.6 fL (ref 78.0–100.0)
Platelets: 238 10*3/uL (ref 150–400)
RBC: 3.14 MIL/uL — AB (ref 3.87–5.11)
RDW: 12.8 % (ref 11.5–15.5)
WBC: 13.5 10*3/uL — ABNORMAL HIGH (ref 4.0–10.5)

## 2017-04-23 MED ORDER — SODIUM CHLORIDE 0.9 % IV BOLUS (SEPSIS)
1000.0000 mL | Freq: Once | INTRAVENOUS | Status: AC
  Administered 2017-04-23: 1000 mL via INTRAVENOUS

## 2017-04-23 NOTE — Progress Notes (Signed)
    Subjective: Procedure(s) (LRB): ANTERIOR LATERAL LUMBAR FUSION (XLIF) with posterior spinal fusion interbody L3-5 (N/A) 1 Day Post-Op  Patient reports pain as 3 on 0-10 scale.  Reports decreased leg pain reports incisional back pain   Foley just removed - will monitor for output Negative bowel movement Negative flatus Negative chest pain or shortness of breath  Objective: Vital signs in last 24 hours: Temp:  [98 F (36.7 C)-99.5 F (37.5 C)] 99 F (37.2 C) (02/21 0727) Pulse Rate:  [67-84] 79 (02/21 0727) Resp:  [10-18] 18 (02/21 0727) BP: (95-114)/(50-73) 96/50 (02/21 0727) SpO2:  [96 %-100 %] 99 % (02/21 0727)  Intake/Output from previous day: 02/20 0701 - 02/21 0700 In: 1800 [I.V.:1800] Out: 1350 [Urine:1200; Blood:150]  Labs: No results for input(s): WBC, RBC, HCT, PLT in the last 72 hours. No results for input(s): NA, K, CL, CO2, BUN, CREATININE, GLUCOSE, CALCIUM in the last 72 hours. No results for input(s): LABPT, INR in the last 72 hours.  Physical Exam: Neurologically intact ABD soft Intact pulses distally Incision: dressing C/D/I Compartment soft Body mass index is 25.84 kg/m.  No hip flexor weakness noted - positive right groin pain/anterior hip pain   Assessment/Plan: Patient stable  xrays pending Continue mobilization with physical therapy Continue care  Advance diet Up with therapy  Xrays today  Melina Schools, MD [place] [phone]

## 2017-04-23 NOTE — Evaluation (Signed)
Physical Therapy Evaluation Patient Details Name: Shelia [name] MRN: [id number] DOB: 1945/09/25 Today's Date: 04/23/2017   History of Present Illness  Pt is a 72 y/o F now s/p Lateral interbody fusion L3-4 and L4-5.  Posterior pedicle screw fixation (segmental) L3-L5. PMHx includes depression, GERD, hyperlipidemia, HTN, and hx of multiple spinal surgeries   Clinical Impression  Pt admitted with above diagnosis. Pt currently with functional limitations due to the deficits listed below (see PT Problem List). At the time of PT eval pt was able to perform transfers and ambulation with up to min assist for balance support and safety. Tolerance for functional activity is low at this time and pt required several rest breaks during session to rest. Pt will benefit from skilled PT to increase their independence and safety with mobility to allow discharge to the venue listed below.       Follow Up Recommendations No PT follow up;Supervision for mobility/OOB    Equipment Recommendations  Rolling walker with 5" wheels    Recommendations for Other Services       Precautions / Restrictions Precautions Precautions: Back Precaution Booklet Issued: Yes (comment) Precaution Comments: reviewed verbally with patient Required Braces or Orthoses: Spinal Brace Spinal Brace: Lumbar corset;Applied in sitting position Restrictions Weight Bearing Restrictions: No      Mobility  Bed Mobility               General bed mobility comments: Pt exiting bathroom when PT arrived.  Transfers Overall transfer level: Needs assistance Equipment used: Rolling walker (2 wheeled) Transfers: Sit to/from Stand Sit to Stand: Min assist         General transfer comment: Patient requires VC for proper hand placement on bed to allow a boost up into standing. Patient tried to stand on her own but required min A to fully stand.   Ambulation/Gait Ambulation/Gait assistance: Supervision Ambulation Distance  (Feet): 250 Feet Assistive device: Rolling walker (2 wheeled) Gait Pattern/deviations: Decreased stride length;Narrow base of support;Step-through pattern Gait velocity: decreased Gait velocity interpretation: Below normal speed for age/gender General Gait Details: Patient demonstrates good posture and hand placement throughout gait. Had to take multiple breaks in order to "catch her breath". Back precautions were reviewed during gait exercise.   Stairs            Wheelchair Mobility    Modified Rankin (Stroke Patients Only)       Balance Overall balance assessment: Needs assistance Sitting-balance support: Feet supported;Single extremity supported Sitting balance-Leahy Scale: Good Sitting balance - Comments: Demonstrates good sitting balance with use of R UE to maintain sitting balance.    Standing balance support: Bilateral upper extremity supported Standing balance-Leahy Scale: Poor Standing balance comment: Relies on UE support (RW).                             Pertinent Vitals/Pain Pain Assessment: Faces Faces Pain Scale: Hurts little more Pain Location: back Pain Descriptors / Indicators: Guarding;Grimacing;Sore Pain Intervention(s): Monitored during session;Limited activity within patient's tolerance    Home Living Family/patient expects to be discharged to:: Private residence Living Arrangements: Spouse/significant other Available Help at Discharge: Family Type of Home: House Home Access: Ramped entrance;Stairs to enter Entrance Stairs-Rails: Psychiatric nurse of Steps: 3 Home Layout: One level Home Equipment: Environmental consultant - 2 wheels;Cane - single point      Prior Function Level of Independence: Independent  Hand Dominance   Dominant Hand: Left    Extremity/Trunk Assessment   Upper Extremity Assessment Upper Extremity Assessment: Defer to OT evaluation    Lower Extremity Assessment Lower Extremity  Assessment: Generalized weakness(Consistent with pre-op diagnosis)    Cervical / Trunk Assessment Cervical / Trunk Assessment: Other exceptions Cervical / Trunk Exceptions: s/p spinal surgery   Communication   Communication: No difficulties  Cognition Arousal/Alertness: Awake/alert Behavior During Therapy: WFL for tasks assessed/performed Overall Cognitive Status: Within Functional Limits for tasks assessed                                        General Comments      Exercises     Assessment/Plan    PT Assessment Patient needs continued PT services  PT Problem List Decreased strength;Decreased range of motion;Decreased activity tolerance;Decreased balance;Decreased mobility;Decreased knowledge of use of DME;Decreased safety awareness;Decreased knowledge of precautions;Pain       PT Treatment Interventions DME instruction;Gait training;Stair training;Functional mobility training;Therapeutic activities;Therapeutic exercise;Neuromuscular re-education;Patient/family education    PT Goals (Current goals can be found in the Care Plan section)  Acute Rehab PT Goals Patient Stated Goal: to feel better  PT Goal Formulation: With patient/family Time For Goal Achievement: 04/30/17 Potential to Achieve Goals: Good    Frequency Min 5X/week   Barriers to discharge        Co-evaluation               AM-PAC PT "6 Clicks" Daily Activity  Outcome Measure Difficulty turning over in bed (including adjusting bedclothes, sheets and blankets)?: Unable Difficulty moving from lying on back to sitting on the side of the bed? : Unable Difficulty sitting down on and standing up from a chair with arms (e.g., wheelchair, bedside commode, etc,.)?: Unable Help needed moving to and from a bed to chair (including a wheelchair)?: A Little Help needed walking in hospital room?: A Little Help needed climbing 3-5 steps with a railing? : A Lot 6 Click Score: 11    End of Session  Equipment Utilized During Treatment: Gait belt;Back brace Activity Tolerance: Patient tolerated treatment well;Patient limited by fatigue Patient left: in chair;with call bell/phone within reach;with family/visitor present;Other (comment)(ice pack on LB and right hip)   PT Visit Diagnosis: Unsteadiness on feet (R26.81);Other abnormalities of gait and mobility (R26.89);Pain;Other symptoms and signs involving the nervous system (R29.898) Pain - part of body: (back)    Time: 6045-4098 PT Time Calculation (min) (ACUTE ONLY): 37 min   Charges:   PT Evaluation $PT Eval Moderate Complexity: 1 Mod PT Treatments $Gait Training: 23-37 mins   PT G Codes:        Rolinda Roan, PT, DPT Acute Rehabilitation Services Pager: [phone]   Thelma Comp 04/23/2017, 1:29 PM

## 2017-04-23 NOTE — Evaluation (Signed)
Occupational Therapy Evaluation Patient Details Name: Shelia [name] MRN: [id number] DOB: [birth year] Today's Date: 04/23/2017    History of Present Illness Pt is a 72 y/o F now s/p Lateral interbody fusion L3-4 and L4-5.  Posterior pedicle screw fixation (segmental) L3-L5. PMHx includes depression, GERD, hyperlipidemia, HTN, and hx of multiple spinal surgeries    Clinical Impression   This 72 y/o F presents with the above. At baseline Pt is independent with ADLs and functional mobility. Pt completing room level functional mobility at RW level with MinA this session, currently requires ModA for LB ADLs, MinA for UB ADLs. Began education regarding AE, safety and compensatory techniques for completing ADLs while adhering to back precautions. Pt will benefit from continued acute OT services prior to discharge to maximize her overall safety and independence with ADLs and mobility.     Follow Up Recommendations  Supervision/Assistance - 24 hour;No OT follow up    Equipment Recommendations  None recommended by OT           Precautions / Restrictions Precautions Precautions: Back Precaution Booklet Issued: Yes (comment) Precaution Comments: issued and reviewed  Required Braces or Orthoses: Spinal Brace Spinal Brace: Lumbar corset;Applied in sitting position Restrictions Weight Bearing Restrictions: No      Mobility Bed Mobility Overal bed mobility: Needs Assistance Bed Mobility: Rolling;Sit to Sidelying;Sidelying to Sit Rolling: Min assist Sidelying to sit: Min guard     Sit to sidelying: Mod assist General bed mobility comments: light assist for rolling and verbal cues for completing log roll technique, increased time to push up into sitting; assist for LE management when returning to supine   Transfers Overall transfer level: Needs assistance Equipment used: Rolling walker (2 wheeled) Transfers: Sit to/from Stand Sit to Stand: Min assist         General transfer  comment: assist to rise and steady at [place], verbal cues for hand placement     Balance Overall balance assessment: Needs assistance Sitting-balance support: Feet supported Sitting balance-Leahy Scale: Good     Standing balance support: Bilateral upper extremity supported Standing balance-Leahy Scale: Poor Standing balance comment: reliant on UE support at this time                            ADL either performed or assessed with clinical judgement   ADL Overall ADL's : Needs assistance/impaired Eating/Feeding: Modified independent;Sitting   Grooming: Wash/dry face;Set up;Sitting   Upper Body Bathing: Minimal assistance;Sitting   Lower Body Bathing: Moderate assistance;Sit to/from stand   Upper Body Dressing : Minimal assistance;Sitting Upper Body Dressing Details (indicate cue type and reason): assist for brace management  Lower Body Dressing: Moderate assistance;Sit to/from stand   Toilet Transfer: Minimal assistance;Ambulation;BSC;RW Toilet Transfer Details (indicate cue type and reason): BSC over toilet  Toileting- Clothing Manipulation and Hygiene: Minimal assistance;Sit to/from stand Toileting - Clothing Manipulation Details (indicate cue type and reason): assist for clothing management      Functional mobility during ADLs: Minimal assistance;Rolling walker General ADL Comments: Pt completing bed mobility and toileting/toilet transfer this session; after ambulating to bathroom and sitting on BSC pt with increased dizziness/lightheadedness, BP taken and 95/66. Pt requires increased time to sit/rest on BSC prior to returning back to bed/supine in bed with +2 for safety during room level mobility. Began education regarding AE and compensatory techniques for completing ADLs while adhering to back precautions, Pt will benefit from continued practice  Pertinent Vitals/Pain Pain Assessment: Faces Faces Pain Scale: Hurts even more Pain  Location: back Pain Descriptors / Indicators: Guarding;Grimacing;Sore Pain Intervention(s): Limited activity within patient's tolerance;Monitored during session;Repositioned          Extremity/Trunk Assessment Upper Extremity Assessment Upper Extremity Assessment: Overall WFL for tasks assessed   Lower Extremity Assessment Lower Extremity Assessment: Defer to PT evaluation   Cervical / Trunk Assessment Cervical / Trunk Assessment: Other exceptions Cervical / Trunk Exceptions: s/p spinal surgery    Communication Communication Communication: No difficulties   Cognition Arousal/Alertness: Awake/alert Behavior During Therapy: WFL for tasks assessed/performed Overall Cognitive Status: Within Functional Limits for tasks assessed                                                      Home Living Family/patient expects to be discharged to:: Private residence Living Arrangements: Spouse/significant other Available Help at Discharge: Family Type of Home: House Home Access: Ramped entrance;Stairs to enter Technical brewer of Steps: 3-4 Entrance Stairs-Rails: Right;Left Home Layout: One level     Bathroom Shower/Tub: Teacher, early years/pre: Standard     Home Equipment: Cane - single point;Bedside commode;Shower seat;Walker - 2 wheels;Adaptive equipment Adaptive Equipment: Reacher        Prior Functioning/Environment Level of Independence: Independent                 OT Problem List: Decreased strength;Decreased activity tolerance;Decreased range of motion;Impaired balance (sitting and/or standing);Decreased knowledge of precautions;Decreased knowledge of use of DME or AE      OT Treatment/Interventions: Self-care/ADL training;DME and/or AE instruction;Therapeutic activities;Balance training;Therapeutic exercise;Patient/family education    OT Goals(Current goals can be found in the care plan section) Acute Rehab OT Goals Patient  Stated Goal: to feel better  OT Goal Formulation: With patient Time For Goal Achievement: 05/07/17 Potential to Achieve Goals: Good  OT Frequency: Min 2X/week                             AM-PAC PT "6 Clicks" Daily Activity     Outcome Measure Help from another person eating meals?: None Help from another person taking care of personal grooming?: A Little Help from another person toileting, which includes using toliet, bedpan, or urinal?: A Lot Help from another person bathing (including washing, rinsing, drying)?: A Lot Help from another person to put on and taking off regular upper body clothing?: A Little Help from another person to put on and taking off regular lower body clothing?: A Lot 6 Click Score: 16   End of Session Equipment Utilized During Treatment: Gait belt;Rolling walker;Back brace Nurse Communication: Mobility status  Activity Tolerance: Patient tolerated treatment well;Other (comment)(limited by dizziness/lightheadedness ) Patient left: in bed;with call bell/phone within reach;with family/visitor present  OT Visit Diagnosis: Other abnormalities of gait and mobility (R26.89);Pain Pain - part of body: (back )                Time: 3244-0102 OT Time Calculation (min): 39 min Charges:  OT General Charges $OT Visit: 1 Visit OT Evaluation $OT Eval Low Complexity: 1 Low OT Treatments $Self Care/Home Management : 8-22 mins G-Codes:     Lou Cal, OT Pager [phone] 04/23/2017   Raymondo Band 04/23/2017, 9:16 AM

## 2017-04-24 LAB — CBC
HCT: 24.6 % — ABNORMAL LOW (ref 36.0–46.0)
HCT: 31.7 % — ABNORMAL LOW (ref 36.0–46.0)
HEMOGLOBIN: 7.9 g/dL — AB (ref 12.0–15.0)
HEMOGLOBIN: 10.2 g/dL — AB (ref 12.0–15.0)
MCH: 28.7 pg (ref 26.0–34.0)
MCH: 28.7 pg (ref 26.0–34.0)
MCHC: 32.1 g/dL (ref 30.0–36.0)
MCHC: 32.2 g/dL (ref 30.0–36.0)
MCV: 89.5 fL (ref 78.0–100.0)
MCV: 89 fL (ref 78.0–100.0)
Platelets: 194 10*3/uL (ref 150–400)
Platelets: 221 10*3/uL (ref 150–400)
RBC: 2.75 MIL/uL — ABNORMAL LOW (ref 3.87–5.11)
RBC: 3.56 MIL/uL — AB (ref 3.87–5.11)
RDW: 13.5 % (ref 11.5–15.5)
RDW: 13 % (ref 11.5–15.5)
WBC: 9.3 10*3/uL (ref 4.0–10.5)
WBC: 11.3 10*3/uL — ABNORMAL HIGH (ref 4.0–10.5)

## 2017-04-24 LAB — PREPARE RBC (CROSSMATCH)

## 2017-04-24 MED ORDER — SODIUM CHLORIDE 0.9 % IV SOLN
Freq: Once | INTRAVENOUS | Status: AC
  Administered 2017-04-24: 11:00:00 via INTRAVENOUS

## 2017-04-24 MED ORDER — FERROUS SULFATE 325 (65 FE) MG PO TABS
325.0000 mg | ORAL_TABLET | Freq: Three times a day (TID) | ORAL | Status: DC
  Administered 2017-04-24 – 2017-04-27 (×12): 325 mg via ORAL
  Filled 2017-04-24 (×12): qty 1

## 2017-04-24 NOTE — NC FL2 (Signed)
[place] LEVEL OF CARE SCREENING TOOL     IDENTIFICATION  Patient Name: Shelia [name] Birthdate: 07-May-1945 Sex: female Admission Date (Current Location): 04/22/2017  [facility] and [place] Number:  Herbalist and Address:  [facility] [place] [address], [place], [place] [address]      Provider Number: [id number]  Attending Physician Name and Address:  Melina Schools, MD  Relative Name and Phone Number:       Current Level of Care: Hospital Recommended Level of Care: [place] Prior Approval Number:    Date Approved/Denied:   PASRR Number: [id number]  Discharge Plan: SNF    Current Diagnoses: Patient Active Problem List   Diagnosis Date Noted  . S/P lumbar fusion 04/22/2017  . Hypertension 02/15/2017  . Near syncope 02/15/2017  . Hypokalemia 02/15/2017  . Vertigo   . Status post lumbar surgery 09/11/2016  . Chronic low back pain 09/11/2016    Orientation RESPIRATION BLADDER Height & Weight     Self, Time, Situation  Normal Continent Weight: 165 lb (74.8 kg) Height:  5\' 7"  (170.2 cm)  BEHAVIORAL SYMPTOMS/MOOD NEUROLOGICAL BOWEL NUTRITION STATUS      Continent (Please see d/c summary)  AMBULATORY STATUS COMMUNICATION OF NEEDS Skin     Verbally Surgical wounds(Right back, honeycomb dressing)                       Personal Care Assistance Level of Assistance  Bathing, Feeding, Dressing Bathing Assistance: Limited assistance Feeding assistance: Independent Dressing Assistance: Limited assistance     Functional Limitations Info  Sight, Hearing, Speech Sight Info: Adequate Hearing Info: Adequate Speech Info: Adequate    SPECIAL CARE FACTORS FREQUENCY  PT (By licensed PT), OT (By licensed OT)     PT Frequency: 5x OT Frequency: 5x            Contractures Contractures Info: Not present    Additional Factors Info  Code Status, Allergies Code Status Info: Full Code Allergies  Info: No known allergies           Current Medications (04/24/2017):  This is the current hospital active medication list Current Facility-Administered Medications  Medication Dose Route Frequency Provider Last Rate Last Dose  . acetaminophen (TYLENOL) tablet 650 mg  650 mg Oral Q4H PRN MayoDarla Lesches, PA-C   650 mg at 04/24/17 0400   Or  . acetaminophen (TYLENOL) suppository 650 mg  650 mg Rectal Q4H PRN Mayo, Darla Lesches, PA-C      . docusate sodium (COLACE) capsule 100 mg  100 mg Oral BID Ronette Deter [place], PA-C   100 mg at 04/24/17 6378  . ferrous sulfate tablet 325 mg  325 mg Oral TID WC Melina Schools, MD   325 mg at 04/24/17 1231  . fluticasone (FLONASE) 50 MCG/ACT nasal spray 2 spray  2 spray Each Nare Daily PRN Mayo, Darla Lesches, PA-C      . hydrochlorothiazide (MICROZIDE) capsule 12.5 mg  12.5 mg Oral Daily Mayo, [place], PA-C      . lactated ringers infusion   Intravenous Continuous Ronette Deter [place], [place] 85 mL/hr at 04/22/17 1655    . losartan (COZAAR) tablet 100 mg  100 mg Oral Daily Mayo, [place], PA-C      . magnesium citrate solution 1 Bottle  1 Bottle Oral Once PRN Mayo, Darla Lesches, PA-C      . menthol-cetylpyridinium (CEPACOL) lozenge 3 mg  1  lozenge Oral PRN Mayo, Darla Lesches, PA-C       Or  . phenol (CHLORASEPTIC) mouth spray 1 spray  1 spray Mouth/Throat PRN Mayo, Darla Lesches, PA-C      . methocarbamol (ROBAXIN) tablet 500 mg  500 mg Oral Q6H PRN MayoDarla Lesches, PA-C   500 mg at 04/24/17 1027  . ondansetron (ZOFRAN) tablet 4 mg  4 mg Oral Q6H PRN Mayo, Darla Lesches, PA-C       Or  . ondansetron [facility]) injection 4 mg  4 mg Intravenous Q6H PRN Mayo, Darla Lesches, PA-C      . oxyCODONE (Oxy IR/ROXICODONE) immediate release tablet 10 mg  10 mg Oral Q3H PRN Mayo, Darla Lesches, PA-C   10 mg at 04/24/17 1231  . oxyCODONE (Oxy IR/ROXICODONE) immediate release tablet 5 mg  5 mg Oral Q3H PRN  Mayo, Darla Lesches, PA-C      . polyethylene glycol (MIRALAX / GLYCOLAX) packet 17 g  17 g Oral Daily PRN Mayo, Darla Lesches, PA-C      . sodium chloride flush (NS) 0.9 % injection 3 mL  3 mL Intravenous Q12H Mayo, [place], PA-C   3 mL at 04/24/17 0818  . sodium chloride flush (NS) 0.9 % injection 3 mL  3 mL Intravenous PRN Mayo, Carmen [place], PA-C      . traZODone (DESYREL) tablet 50 mg  50 mg Oral QHS PRN Mayo, Darla Lesches, PA-C   50 mg at 04/23/17 2132     Discharge Medications: Please see discharge summary for a list of discharge medications.  Relevant Imaging Results:  Relevant Lab Results:   Additional Information SSN: [id number]  Eileen Stanford, LCSW

## 2017-04-24 NOTE — Clinical Social Work Note (Signed)
Clinical Social Work Assessment  Patient Details  Name: Shelia [name] MRN: [id number] Date of Birth: Feb 17, 1946  Date of referral:  04/24/17               Reason for consult:  Facility Placement                Permission sought to share information with:  Family Supports Permission granted to share information::     Name::     Comptroller::     Relationship::  spouse  Contact Information:     Housing/Transportation Living arrangements for the past 2 months:  Single Family Home Source of Information:  Patient Patient Interpreter Needed:  None Criminal Activity/Legal Involvement Pertinent to Current Situation/Hospitalization:  No - Comment as needed Significant Relationships:  Adult Children, Spouse Lives with:  Spouse Do you feel safe going back to the place where you live?    Need for family participation in patient care:     Care giving concerns:  CSW spoke with pt at bedside. Pt lives at home with spouse.   Social Worker assessment / plan:  CSW spoke with pt at bedside. Pt's spouse and daughter were present. Pt was hopeful for CIR howver, is aware SNF will be needed for backup. Pt is agreeable to SNF placement at d/c. Pt lives at home with her spouse however he works everyday and her daughter lives out of town. Pt's preferences are [place], [place], or [place]. CSW will send referral and follow up about bed offers.  Employment status:  Retired Forensic scientist:  Medicare PT Recommendations:  [place] / Referral to community resources:  [place]  Patient/Family's Response to care:  Pt verbalized understanding of CSW role and expressed appreciation for support. Pt denies any concern regarding pt care at this time.   Patient/Family's Understanding of and Emotional Response to Diagnosis, Current Treatment, and Prognosis:  Pt understanding and realistic regarding physical limitations. Pt understands the need for SNF placement at  d/c. Pt agreeable to SNF placement at d/c, at this time. Pt's responses emotionally appropriate during conversation with CSW. Pt denies any concern regarding treatment plan at this time. CSW will continue to provide support and facilitate d/c needs.   Emotional Assessment Appearance:  Appears stated age Attitude/Demeanor/Rapport:  (Paitent is appropriate) Affect (typically observed):  Accepting, Appropriate, Calm Orientation:  Oriented to Situation, Oriented to  Time, Oriented to Place, Oriented to Self Alcohol / Substance use:  Not Applicable Psych involvement (Current and /or in the community):  No (Comment)  Discharge Needs  Concerns to be addressed:  Care Coordination, Basic Needs Readmission within the last 30 days:  No Current discharge risk:  Dependent with Mobility Barriers to Discharge:  Continued Medical Work up   [employer], LCSW 04/24/2017, 2:43 PM

## 2017-04-24 NOTE — Progress Notes (Addendum)
Physical Therapy Treatment Patient Details Name: Shelia [name] MRN: [id number] DOB: [birth year] Today's Date: 04/24/2017    History of Present Illness Pt is a 72 y/o F now s/p Lateral interbody fusion L3-4 and L4-5.  Posterior pedicle screw fixation (segmental) L3-L5. PMHx includes depression, GERD, hyperlipidemia, HTN, and hx of multiple spinal surgeries     PT Comments    Pt progressing slowly towards physical therapy goals. Pain and new reports of burning symptoms down her legs into her heels. Overall pt moving slowly and requiring assistance for transfers, and hands-on guarding for ambulation. We were not able to initiate stair training this session due to pain and decreased tolerance for functional activity. Pt will require 24 hour assistance at d/c for safety with mobility and completion of ADL's. CIR would be 1st choice of follow-up therapy, however noted MD's referral for CIR and subsequent CIR denial. Pt may benefit from the [facility] program if eligible. If she is not eligible, will require SNF level follow up prior to return home.  Follow Up Recommendations  SNF;Supervision/Assistance - 24 hour     Equipment Recommendations  Rolling walker with 5" wheels    Recommendations for Other Services       Precautions / Restrictions Precautions Precautions: Back Precaution Booklet Issued: Yes (comment) Precaution Comments: reviewed verbally with patient Required Braces or Orthoses: Spinal Brace Spinal Brace: Lumbar corset;Applied in sitting position Restrictions Weight Bearing Restrictions: No    Mobility  Bed Mobility               General bed mobility comments: Pt sitting up in the recliner upon PT arrival.   Transfers Overall transfer level: Needs assistance Equipment used: Rolling walker (2 wheeled) Transfers: Sit to/from Stand Sit to Stand: Min assist         General transfer comment: VC's for scooting to edge of chair and hand placement on seated  surface for safety. Pt was able to power-up to full stand with min assist and increased time for safety.   Ambulation/Gait Ambulation/Gait assistance: Min guard Ambulation Distance (Feet): 125 Feet Assistive device: Rolling walker (2 wheeled) Gait Pattern/deviations: Decreased stride length;Narrow base of support;Step-through pattern Gait velocity: decreased Gait velocity interpretation: Below normal speed for age/gender General Gait Details: Slow and guarded with episodes of unsteadiness noted. Pt required hands-on guarding throughout gait training for safety.    Stairs            Wheelchair Mobility    Modified Rankin (Stroke Patients Only)       Balance Overall balance assessment: Needs assistance Sitting-balance support: Feet supported;Single extremity supported Sitting balance-Leahy Scale: Fair Sitting balance - Comments: Guarded due to pain   Standing balance support: Bilateral upper extremity supported Standing balance-Leahy Scale: Poor Standing balance comment: Relies on UE support (RW).                            Cognition Arousal/Alertness: Awake/alert Behavior During Therapy: WFL for tasks assessed/performed Overall Cognitive Status: Within Functional Limits for tasks assessed                                        Exercises  Glute sets x5; Quad sets x5 - educated on up to x15 reps and progressing as able    General Comments        Pertinent Vitals/Pain Pain Assessment:  Faces Faces Pain Scale: Hurts whole lot Pain Location: back Pain Descriptors / Indicators: Guarding;Grimacing;Sore Pain Intervention(s): Limited activity within patient's tolerance;Monitored during session;Repositioned    Home Living                      Prior Function            PT Goals (current goals can now be found in the care plan section) Acute Rehab PT Goals Patient Stated Goal: to feel better  PT Goal Formulation: With  patient/family Time For Goal Achievement: 04/30/17 Potential to Achieve Goals: Good Progress towards PT goals: Progressing toward goals    Frequency    Min 5X/week      PT Plan Discharge plan needs to be updated    Co-evaluation              AM-PAC PT "6 Clicks" Daily Activity  Outcome Measure  Difficulty turning over in bed (including adjusting bedclothes, sheets and blankets)?: Unable Difficulty moving from lying on back to sitting on the side of the bed? : Unable Difficulty sitting down on and standing up from a chair with arms (e.g., wheelchair, bedside commode, etc,.)?: Unable Help needed moving to and from a bed to chair (including a wheelchair)?: A Little Help needed walking in hospital room?: A Little Help needed climbing 3-5 steps with a railing? : A Lot 6 Click Score: 11    End of Session Equipment Utilized During Treatment: Gait belt;Back brace Activity Tolerance: Patient tolerated treatment well;Patient limited by fatigue Patient left: in bed;with call bell/phone within reach;with family/visitor present Nurse Communication: Mobility status PT Visit Diagnosis: Unsteadiness on feet (R26.81);Other abnormalities of gait and mobility (R26.89);Pain;Other symptoms and signs involving the nervous system (R29.898) Pain - part of body: (back)     Time: 2641-5830 PT Time Calculation (min) (ACUTE ONLY): 51 min  Charges:  $Gait Training: 38-52 mins                    G Codes:       Rolinda Roan, PT, DPT Acute Rehabilitation Services Pager: [place] 04/24/2017, 10:40 AM

## 2017-04-24 NOTE — Progress Notes (Signed)
I was contacted by pt's Nurse that family request to meet with CIR representative to understand why pt not a candidate to admit. I met at bedside with pt, spouse and daughter at bedside. I explained that PT and OT have never recommended an inpatient acute rehab admit for she is doing functionally very well and not in need of an inpt rehab intensity of therapy or Hospital medical management while receiving that level of rehab. They state they discussed with Dr. Rolena Infante today, but also felt she was not at a level to be discharged to home at this time. They have requested SNF and are currently waiting SNF placement. All appreciative of clarification. I have updated RN CM of my visit. [phone]

## 2017-04-24 NOTE — Progress Notes (Signed)
Occupational Therapy Treatment Patient Details Name: Shelia [name] MRN: [id number] DOB: 10-22-1945 Today's Date: 04/24/2017    History of present illness Pt is a 72 y/o F now s/p Lateral interbody fusion L3-4 and L4-5.  Posterior pedicle screw fixation (segmental) L3-L5. PMHx includes depression, GERD, hyperlipidemia, HTN, and hx of multiple spinal surgeries    OT comments  This 72 yo female admitted and underwent above seen today for addressing LBADLs with AE, tub transfers, bed mobility, and tolieting transfers/toileting. Pt making progress towards these goals with pain still a big issue and thus pt moving slowly.   Follow Up Recommendations  SNF;Supervision/Assistance - 24 hour;Other (comment)("[facility]" if declined SNF)    Equipment Recommendations  None recommended by OT       Precautions / Restrictions Precautions Precautions: Back Precaution Booklet Issued: Yes (comment) Precaution Comments: reviewed verbally with patient Required Braces or Orthoses: Spinal Brace Spinal Brace: Lumbar corset;Applied in sitting position Restrictions Weight Bearing Restrictions: No       Mobility Bed Mobility Overal bed mobility: Needs Assistance Bed Mobility: Rolling;Sidelying to Sit;Sit to Sidelying Rolling: Min guard(with increased time and no rail) Sidelying to sit: Min assist(to elevate trunk and increased time)     Sit to sidelying: Mod assist(for legs) General bed mobility comments: Pt sitting up in the recliner upon PT arrival.   Transfers Overall transfer level: Needs assistance Equipment used: Rolling walker (2 wheeled) Transfers: Sit to/from Stand Sit to Stand: Min guard         General transfer comment: raised bed to height that husband reports is height of bed at home    Balance Overall balance assessment: Needs assistance Sitting-balance support: Feet supported;No upper extremity supported Sitting balance-Leahy Scale: Fair Sitting balance - Comments:  Guarded due to pain   Standing balance support: Bilateral upper extremity supported Standing balance-Leahy Scale: Poor Standing balance comment: Relies on UE support (RW).                           ADL either performed or assessed with clinical judgement   ADL Overall ADL's : Needs assistance/impaired                         Toilet Transfer: Min guard;Ambulation;BSC;RW Toilet Transfer Details (indicate cue type and reason): BSC over toilet  Toileting- Clothing Manipulation and Hygiene: Minimal assistance Toileting - Clothing Manipulation Details (indicate cue type and reason): min guard A sit<>stand   Tub/Shower Transfer Details (indicate cue type and reason): Described and demonstrated side step into shower and then sit<>stand from seat with "shelf technique" with husband A or use of wall of tub on one side and RW on side outside tub (husband steadying walker)   General ADL Comments: Educated pt on use of reacher to doff socks (which she has done before) and sock aid to don socks, pt needed min A due to decreased strength in Bil LEs making it hard to lift feet off of the floor.      Vision Patient Visual Report: No change from baseline            Cognition Arousal/Alertness: Awake/alert Behavior During Therapy: WFL for tasks assessed/performed Overall Cognitive Status: Within Functional Limits for tasks assessed  Pertinent Vitals/ Pain       Pain Assessment: Faces Faces Pain Scale: Hurts whole lot Pain Location: incision site Pain Descriptors / Indicators: Guarding;Grimacing;Sore Pain Intervention(s): Limited activity within patient's tolerance;Monitored during session;Repositioned         Frequency  Min 3X/week        Progress Toward Goals  OT Goals(current goals can now be found in the care plan section)  Progress towards OT goals: Progressing toward goals  Acute  Rehab OT Goals Patient Stated Goal: to feel better   Plan Discharge plan needs to be updated       AM-PAC PT "6 Clicks" Daily Activity     Outcome Measure   Help from another person eating meals?: None Help from another person taking care of personal grooming?: A Little Help from another person toileting, which includes using toliet, bedpan, or urinal?: A Little Help from another person bathing (including washing, rinsing, drying)?: A Lot Help from another person to put on and taking off regular upper body clothing?: A Little Help from another person to put on and taking off regular lower body clothing?: A Lot 6 Click Score: 17    End of Session Equipment Utilized During Treatment: Rolling walker  OT Visit Diagnosis: Other abnormalities of gait and mobility (R26.89);Pain Pain - part of body: (incision site and back)   Activity Tolerance Patient tolerated treatment well(despite increased pain)   Patient Left in bed;with call bell/phone within reach;with family/visitor present   Nurse Communication          Time: 6063-0160 OT Time Calculation (min): 48 min  Charges: OT General Charges $OT Visit: 1 Visit OT Treatments $Self Care/Home Management : 23-37 mins  [address], OTR/L [phone] 04/24/2017

## 2017-04-24 NOTE — Progress Notes (Signed)
    Subjective: 2 Days Post-Op Procedure(s) (LRB): ANTERIOR LATERAL LUMBAR FUSION (XLIF) with posterior spinal fusion interbody L3-5 (N/A) Patient reports pain as 6 on 0-10 scale.   Denies CP or SOB.  Voiding without difficulty. Positive flatus. Incisional pain.  Leg weakness.  Objective: Vital signs in last 24 hours: Temp:  [98.1 F (36.7 C)-100.5 F (38.1 C)] 98.7 F (37.1 C) (02/22 1344) Pulse Rate:  [84-104] 93 (02/22 1344) Resp:  [16-18] 16 (02/22 1145) BP: (94-127)/(38-81) 110/48 (02/22 1344) SpO2:  [97 %-100 %] 100 % (02/22 1344)  Intake/Output from previous day: No intake/output data recorded. Intake/Output this shift: Total I/O In: 680 [I.V.:50; Blood:630] Out: -   Labs: Recent Labs    04/23/17 1135 04/24/17 0559  HGB 8.8* 7.9*   Recent Labs    04/23/17 1135 04/24/17 0559  WBC 13.5* 9.3  RBC 3.14* 2.75*  HCT 27.5* 24.6*  PLT 238 194   No results for input(s): NA, K, CL, CO2, BUN, CREATININE, GLUCOSE, CALCIUM in the last 72 hours. No results for input(s): LABPT, INR in the last 72 hours.  Physical Exam: Neurologically intact ABD soft Sensation intact distally Dorsiflexion/Plantar flexion intact Incision: scant drainage Compartment soft Body mass index is 25.84 kg/m.   Assessment/Plan: 2 Days Post-Op Procedure(s) (LRB): ANTERIOR LATERAL LUMBAR FUSION (XLIF) with posterior spinal fusion interbody L3-5 (N/A) Pt is not a candidate for inpatient rehab SW is working on admission to a rehab facility Pt is being moved to [place] [facility] because [facility] is closing Continue to monitor Hgb and BP  [place], Darla Lesches for Dr. Melina Schools [facility] [phone] 04/24/2017, 3:35 PM

## 2017-04-24 NOTE — Progress Notes (Addendum)
    Subjective: Procedure(s) (LRB): ANTERIOR LATERAL LUMBAR FUSION (XLIF) with posterior spinal fusion interbody L3-5 (N/A) 2 Days Post-Op  Patient reports pain as 5 on 0-10 scale.  Reports unchanged leg pain reports incisional back pain   Positive void Negative bowel movement Positive flatus Negative chest pain or shortness of breath  Objective: Vital signs in last 24 hours: Temp:  [98.6 F (37 C)-100.5 F (38.1 C)] 98.6 F (37 C) (02/22 0405) Pulse Rate:  [84-104] 87 (02/22 0405) Resp:  [16-18] 16 (02/22 0405) BP: (94-107)/(51-57) 94/51 (02/22 0405) SpO2:  [97 %-100 %] 97 % (02/22 0405)  Intake/Output from previous day: No intake/output data recorded.  Labs: Recent Labs    04/23/17 1135 04/24/17 0559  WBC 13.5* 9.3  RBC 3.14* 2.75*  HCT 27.5* 24.6*  PLT 238 194   No results for input(s): NA, K, CL, CO2, BUN, CREATININE, GLUCOSE, CALCIUM in the last 72 hours. No results for input(s): LABPT, INR in the last 72 hours.  Physical Exam: Neurologically intact ABD soft Intact pulses distally Incision: dressing C/D/I Compartment soft r hip flexor: positive pain with ROM but limiting ambulation Body mass index is 25.84 kg/m.  Assessment/Plan:  Patient stable  xrays satisfactory - endplate deformity noted implant is stable Continue mobilization with physical therapy Continue care  Advance diet Up with therapy 1. Constipation: enema and mag citrate today 2. CIR consult given slow progress 3. Post-op anemia noted will transfuse 1 unit today and re-check in AM 4. Monitor BP and continue to hold HTN meds for now   Melina Schools, MD [place] [phone]  Please note clarification.  Patient has Acute postop Blood Loss Anemia requiring the transfusion and supplemental iron.

## 2017-04-24 NOTE — Care Management Note (Signed)
Case Management Note  Patient Details  Name: Shelia [name] MRN: [id number] Date of Birth: 03-07-1945  Subjective/Objective:    72 yr old female s/p `L3-L5 ALIF.                Action/Plan: Case manager spoke with patient, Shelia [name] and [name] concerning discharge plan. Patient has DME. Case manager had patient screended by Beryle Beams with [facility], she would benefit from their services per PT eval, but patient declines. Patient and family express interest in CIR. CM explained that per notes,she currently doesn't meet their criteria for admission, but they are requesting to speak with someone from CIR. CM had bedside RN contact Danne Baxter. Case manager contacted social worker also. Patient says that she cant be home alone and Shelia [name] works.  CM will continue to monitor for disposition. Patient will likely be transferred to a medical unit today, [facility] will be closing.     Expected Discharge Date:  (Pending)               Expected Discharge Plan:     In-House Referral:  Clinical Social Work  Discharge planning Services  CM Consult  Post Acute Care Choice:    Choice offered to:  Patient, Spouse, Adult Children  DME Arranged:    DME Agency:     HH Arranged:    [place] Agency:     Status of Service:  In process, will continue to follow  If discussed at Long Length of Stay Meetings, dates discussed:    Additional Comments:  Ninfa Meeker, RN 04/24/2017, 1:08 PM

## 2017-04-24 NOTE — Progress Notes (Signed)
Patient is discharged from room 3C04 and transferred to room 4NP04 at this time. Alert and in stable condition. Report given to receiving nurse Claiborne Billings, RN with all questions answered. Transported out of unit via bed with husband and all belongings at side.

## 2017-04-24 NOTE — Progress Notes (Signed)
1 unit PRBC transfused without any adverse reaction noted.

## 2017-04-24 NOTE — Progress Notes (Signed)
Physical medicine rehabilitation consult requested chart reviewed. Patient is presently ambulating 250 feet supervision with a rolling walker. Physical and occupational therapy evaluations completed no further follow-up therapies are recommended. Patient will not need inpatient rehabilitation services. Recommendations are discharged to home

## 2017-04-24 NOTE — Discharge Summary (Signed)
Physician Discharge Summary  Patient ID: Shelia [name] MRN: [id number] DOB/AGE: 10/26/1945 72 y.o.  Admit date: 04/22/2017 Discharge date: 04/24/2017  Admission Diagnoses:  Failed Back Syndrome, Lumbar aDDD  Discharge Diagnoses:  Active Problems:   S/P lumbar fusion   Past Medical History:  Diagnosis Date  . Anxiety   . Cystitis   . Depression   . DJD (degenerative joint disease)   . GERD (gastroesophageal reflux disease)   . Hyperlipidemia   . Hypertension   . Irritable bowel syndrome (IBS)   . PONV (postoperative nausea and vomiting)    with gallbladder surgery 2014  . Spinal stenosis     Surgeries: Procedure(s): ANTERIOR LATERAL LUMBAR FUSION (XLIF) with posterior spinal fusion interbody L3-5 on 04/22/2017   Consultants (if any):   Discharged Condition: Improved  Hospital Course: Shelia [name] is an 72 y.o. female who was admitted 04/22/2017 with a diagnosis of Lumbar DDD and went to the operating room on 04/22/2017 and underwent the above named procedures.  Post op day one pt is noting significant incisional pain.  Pt is voiding and ambulating.  Noted some dizziness.  Checked CBC.  HGB 8.8.  Checked again Post op day 2 gave pt one unit of RBC.  INpatient rehab denied.  Seeking placement in rehab facility.   She was given perioperative antibiotics:  Anti-infectives (From admission, onward)   Start     Dose/Rate Route Frequency Ordered Stop   04/22/17 1615  ceFAZolin (ANCEF) IVPB 2g/100 mL premix     2 g 200 mL/hr over 30 Minutes Intravenous Every 8 hours 04/22/17 1605 04/23/17 0000   04/22/17 0710  ceFAZolin (ANCEF) 2-4 GM/100ML-% IVPB    Comments:  Maryjean Ka   : cabinet override      04/22/17 0710 04/22/17 1914   04/22/17 0705  ceFAZolin (ANCEF) IVPB 2g/100 mL premix     2 g 200 mL/hr over 30 Minutes Intravenous 30 min pre-op 04/22/17 0705 04/22/17 1324    .  She was given sequential compression devices, early ambulation, and TED for DVT  prophylaxis.  She benefited maximally from the hospital stay and there were no complications.    Recent vital signs:  Vitals:   04/24/17 1145 04/24/17 1344  BP: (!) 118/43 (!) 110/48  Pulse: 92 93  Resp: 16   Temp: 98.1 F (36.7 C) 98.7 F (37.1 C)  SpO2: 100% 100%    Recent laboratory studies:  Lab Results  Component Value Date   HGB 7.9 (L) 04/24/2017   HGB 8.8 (L) 04/23/2017   HGB 12.5 04/16/2017   Lab Results  Component Value Date   WBC 9.3 04/24/2017   PLT 194 04/24/2017   No results found for: INR Lab Results  Component Value Date   NA 140 04/16/2017   K 3.3 (L) 04/16/2017   CL 101 04/16/2017   CO2 26 04/16/2017   BUN 13 04/16/2017   CREATININE 1.21 (H) 04/16/2017   GLUCOSE 101 (H) 04/16/2017    Discharge Medications:   Allergies as of 04/24/2017      Reactions   No Known Allergies       Medication List    STOP taking these medications   aspirin EC 81 MG tablet   celecoxib 200 MG capsule Commonly known as:  CELEBREX   losartan 100 MG tablet Commonly known as:  COZAAR   methocarbamol 500 MG tablet Commonly known as:  ROBAXIN   zolpidem 10 MG tablet Commonly known as:  AMBIEN  TAKE these medications   baclofen 10 MG tablet Commonly known as:  LIORESAL Take 1 tablet (10 mg total) by mouth 3 (three) times daily.   cholecalciferol 1000 units tablet Commonly known as:  VITAMIN D Take 1,000 Units by mouth daily.   fish oil-omega-3 fatty acids 1000 MG capsule Take 1 g by mouth daily.   fluticasone 50 MCG/ACT nasal spray Commonly known as:  FLONASE Place 2 sprays into both nostrils daily as needed for allergies or rhinitis.   hydrochlorothiazide 25 MG tablet Commonly known as:  HYDRODIURIL Take 12.5-25 mg by mouth daily. Dose varies based on fluid retention/blood pressure.   HYDROcodone-acetaminophen 10-325 MG tablet Commonly known as:  NORCO Take 1 tablet by mouth 3 (three) times daily as needed (typically morning & midday). For  pain.   ondansetron 4 MG disintegrating tablet Commonly known as:  ZOFRAN ODT Take 1 tablet (4 mg total) by mouth every 8 (eight) hours as needed for nausea or vomiting.   oxyCODONE-acetaminophen 10-325 MG tablet Commonly known as:  PERCOCET Take 1 tablet by mouth every 4 (four) hours as needed for pain.       Diagnostic Studies: Dg Lumbar Spine 2-3 Views  Result Date: 04/23/2017 CLINICAL DATA:  Postop back surgery. EXAM: LUMBAR SPINE - 2-3 VIEW COMPARISON:  04/22/2017. 09/12/2016. FINDINGS: Status post PLIF L3 through L5. Bilateral pedicle screws and posterior rods are in place. Interbody spacers are in place. There is some superior endplate deformity at L4 that was not visible on the radiographs of 2018 and could possibly be a minor superior endplate fracture. There is also a small lucency at the anterior inferior aspect of the L4 vertebral body that could indicate a minor inferior endplate fracture. Distant interbody fusion at L5-S1. IMPRESSION: Recently performed PLIF L3 through L5. Question minimal endplate fractures at L4 secondary to insufficiency. Electronically Signed   By: Nelson Chimes M.D.   On: 04/23/2017 09:25   Dg Lumbar Spine 2-3 Views  Result Date: 04/22/2017 CLINICAL DATA:  L3-L5 XLIF. EXAM: DG C-ARM 61-120 MIN; LUMBAR SPINE - 2-3 VIEW COMPARISON:  Lumbar spine x-rays dated September 12, 2016. FINDINGS: Intraoperative x-rays demonstrate interval L3-L5 posterior and interbody fusion. New slight compression deformity of the L4 superior endplate. Trace anterolisthesis at L4-L5, improved when compared to prior study. IMPRESSION: 1. Intraoperative x-rays for L3-L5 fusion. New slight compression deformity of the L4 superior endplate. FLUOROSCOPY TIME:  7 minutes, 56 seconds. C-arm fluoroscopic images were obtained intraoperatively and submitted for post operative interpretation. Electronically Signed   By: Titus Dubin M.D.   On: 04/22/2017 13:23   Dg C-arm 1-60 Min  Result Date:  04/22/2017 CLINICAL DATA:  L3-L5 XLIF. EXAM: DG C-ARM 61-120 MIN; LUMBAR SPINE - 2-3 VIEW COMPARISON:  Lumbar spine x-rays dated September 12, 2016. FINDINGS: Intraoperative x-rays demonstrate interval L3-L5 posterior and interbody fusion. New slight compression deformity of the L4 superior endplate. Trace anterolisthesis at L4-L5, improved when compared to prior study. IMPRESSION: 1. Intraoperative x-rays for L3-L5 fusion. New slight compression deformity of the L4 superior endplate. FLUOROSCOPY TIME:  7 minutes, 56 seconds. C-arm fluoroscopic images were obtained intraoperatively and submitted for post operative interpretation. Electronically Signed   By: Titus Dubin M.D.   On: 04/22/2017 13:23   Dg C-arm 1-60 Min  Result Date: 04/22/2017 CLINICAL DATA:  L3-L5 XLIF. EXAM: DG C-ARM 61-120 MIN; LUMBAR SPINE - 2-3 VIEW COMPARISON:  Lumbar spine x-rays dated September 12, 2016. FINDINGS: Intraoperative x-rays demonstrate interval L3-L5 posterior and interbody  fusion. New slight compression deformity of the L4 superior endplate. Trace anterolisthesis at L4-L5, improved when compared to prior study. IMPRESSION: 1. Intraoperative x-rays for L3-L5 fusion. New slight compression deformity of the L4 superior endplate. FLUOROSCOPY TIME:  7 minutes, 56 seconds. C-arm fluoroscopic images were obtained intraoperatively and submitted for post operative interpretation. Electronically Signed   By: Titus Dubin M.D.   On: 04/22/2017 13:23   Dg C-arm 1-60 Min  Result Date: 04/22/2017 CLINICAL DATA:  L3-L5 XLIF. EXAM: DG C-ARM 61-120 MIN; LUMBAR SPINE - 2-3 VIEW COMPARISON:  Lumbar spine x-rays dated September 12, 2016. FINDINGS: Intraoperative x-rays demonstrate interval L3-L5 posterior and interbody fusion. New slight compression deformity of the L4 superior endplate. Trace anterolisthesis at L4-L5, improved when compared to prior study. IMPRESSION: 1. Intraoperative x-rays for L3-L5 fusion. New slight compression deformity of the  L4 superior endplate. FLUOROSCOPY TIME:  7 minutes, 56 seconds. C-arm fluoroscopic images were obtained intraoperatively and submitted for post operative interpretation. Electronically Signed   By: Titus Dubin M.D.   On: 04/22/2017 13:23    Disposition: 01-Home or Self Care Post op meds and AVS on chart Pt will present to clinic in 2 weeks Pt will go to Rehab after discharge from hospital Discharge Instructions    Incentive spirometry RT   Complete by:  As directed       Follow-up Information    Melina Schools, MD Follow up in 2 week(s).   Specialty:  Orthopedic Surgery Contact information: [address] [place] [address] [phone]            Signed: Valinda Hoar 04/24/2017, 3:42 PM

## 2017-04-25 LAB — BPAM RBC
BLOOD PRODUCT EXPIRATION DATE: 201902272359
ISSUE DATE / TIME: 201902221048
UNIT TYPE AND RH: 6200

## 2017-04-25 LAB — TYPE AND SCREEN
ABO/RH(D): A POS
ANTIBODY SCREEN: NEGATIVE
Unit division: 0

## 2017-04-25 NOTE — Progress Notes (Signed)
1900: Handoff report received from RN. Pt resting in bed. Discussed plan of care for the shift; pt amenable to plan.  0000: Pt resting comfortably.  0100: Pt able to ambulate from bed to bathroom and back independently.  0400: Pt continues resting comfortably.  0700: Handoff report given to RN. No acute events overnight.

## 2017-04-25 NOTE — Progress Notes (Signed)
Physical Therapy Treatment Patient Details Name: Shelia [name] MRN: [id number] DOB: February 28, 1946 Today's Date: 04/25/2017    History of Present Illness Pt is a 72 y/o F now s/p Lateral interbody fusion L3-4 and L4-5.  Posterior pedicle screw fixation (segmental) L3-L5. PMHx includes depression, GERD, hyperlipidemia, HTN, and hx of multiple spinal surgeries     PT Comments    Pt slowly progressing towards functional mobility goals with pain as her largest limiting factor. Patient requires min assist for bed mobility and transfers demonstrating very slow, guarded movement. Will continue to follow acutely and progress as tolerated to maximize functional mobility, safety, and independence.   Follow Up Recommendations  SNF;Supervision/Assistance - 24 hour     Equipment Recommendations  Rolling walker with 5" wheels    Recommendations for Other Services       Precautions / Restrictions Precautions Precautions: Back Precaution Comments: reviewed verbally with patient, able to recall 1/3 precautions Required Braces or Orthoses: Spinal Brace Spinal Brace: Lumbar corset;Applied in sitting position Restrictions Weight Bearing Restrictions: No    Mobility  Bed Mobility Overal bed mobility: Needs Assistance Bed Mobility: Rolling;Sidelying to Sit;Sit to Sidelying Rolling: Min assist(increased time) Sidelying to sit: Min assist       General bed mobility comments: min assist for rolling and elevating trunk to upright, VCs for sequencing and to maintian back precautions. increased time required throughout.  Transfers Overall transfer level: Needs assistance Equipment used: Rolling walker (2 wheeled) Transfers: Sit to/from Stand Sit to Stand: Min guard;From elevated surface         General transfer comment: pt request to raise the bed as high as possible, per pt bed is able to be elevated at home. VCs for hand placement. increased time  required.  Ambulation/Gait Ambulation/Gait assistance: Min guard Ambulation Distance (Feet): 20 Feet Assistive device: Rolling walker (2 wheeled) Gait Pattern/deviations: Decreased stride length;Narrow base of support;Step-through pattern Gait velocity: decreased Gait velocity interpretation: Below normal speed for age/gender General Gait Details: pt ambulated to bathroom commode for multiple attempts at toileting and back to bed. slow, guarded gait with increased weight through B UEs. slight instability noted.    Stairs            Wheelchair Mobility    Modified Rankin (Stroke Patients Only)       Balance Overall balance assessment: Needs assistance Sitting-balance support: Bilateral upper extremity supported Sitting balance-Leahy Scale: Fair Sitting balance - Comments: guarded due to pain with increased weight through bil UEs   Standing balance support: Bilateral upper extremity supported Standing balance-Leahy Scale: Poor Standing balance comment: Relies on UE support (RW).                            Cognition Arousal/Alertness: Awake/alert Behavior During Therapy: WFL for tasks assessed/performed;Agitated Overall Cognitive Status: Within Functional Limits for tasks assessed                                        Exercises      General Comments General comments (skin integrity, edema, etc.): husband present throughout session.       Pertinent Vitals/Pain Pain Assessment: 0-10 Pain Score: 10-Worst pain ever Pain Location: incision site Pain Descriptors / Indicators: Aching;Guarding;Grimacing;Sore Pain Intervention(s): Monitored during session;Limited activity within patient's tolerance;Repositioned;RN gave pain meds during session;Patient requesting pain meds-RN notified  Pain meds administered by  nurse during session with pt sitting EOB.     Home Living                      Prior Function            PT Goals  (current goals can now be found in the care plan section) Acute Rehab PT Goals Patient Stated Goal: to feel better  PT Goal Formulation: With patient/family Time For Goal Achievement: 04/30/17 Potential to Achieve Goals: Good Progress towards PT goals: Progressing toward goals    Frequency    Min 5X/week      PT Plan Current plan remains appropriate    Co-evaluation              AM-PAC PT "6 Clicks" Daily Activity  Outcome Measure  Difficulty turning over in bed (including adjusting bedclothes, sheets and blankets)?: Unable Difficulty moving from lying on back to sitting on the side of the bed? : Unable Difficulty sitting down on and standing up from a chair with arms (e.g., wheelchair, bedside commode, etc,.)?: Unable Help needed moving to and from a bed to chair (including a wheelchair)?: A Little Help needed walking in hospital room?: A Little Help needed climbing 3-5 steps with a railing? : A Lot 6 Click Score: 11    End of Session Equipment Utilized During Treatment: Gait belt;Back brace Activity Tolerance: Patient limited by pain Patient left: in bed;with nursing/sitter in room;with family/visitor present Pt left sitting EOB with RN and husband present as patient adamant that her husband assist her to change into clothing from hospital gown while sitting EOB. Confirmed this was okay with nurse. Nurse Communication: Mobility status;Patient requests pain meds PT Visit Diagnosis: Unsteadiness on feet (R26.81);Other abnormalities of gait and mobility (R26.89);Pain;Other symptoms and signs involving the nervous system (R29.898) Pain - part of body: (back, right hip)     Time: 5456-2563 PT Time Calculation (min) (ACUTE ONLY): 37 min  Charges:  $Therapeutic Exercise: 23-37 mins                    G Codes:       Vic Ripper, SPT   Vic Ripper 04/25/2017, 12:16 PM

## 2017-04-25 NOTE — Progress Notes (Signed)
Subjective: 3 Days Post-Op Procedure(s) (LRB): ANTERIOR LATERAL LUMBAR FUSION (XLIF) with posterior spinal fusion interbody L3-5 (N/A) Patient reports pain as 3 on 0-10 scale. Dressin is dry. She continues to have PRE-OP weakness in her Left foot but states that she is some better.HBG is now 10.2.   Objective: Vital signs in last 24 hours: Temp:  [98.1 F (36.7 C)-100.6 F (38.1 C)] 99.2 F (37.3 C) (02/23 0753) Pulse Rate:  [81-93] 91 (02/23 0356) Resp:  [15-18] 15 (02/23 0021) BP: (106-133)/(38-61) 121/61 (02/23 0356) SpO2:  [97 %-100 %] 97 % (02/23 0356) Weight:  [79.3 kg (174 lb 13.2 oz)] 79.3 kg (174 lb 13.2 oz) (02/22 1700)  Intake/Output from previous day: 02/22 0701 - 02/23 0700 In: 680 [I.V.:50; Blood:630] Out: -  Intake/Output this shift: No intake/output data recorded.  Recent Labs    04/23/17 1135 04/24/17 0559 04/24/17 1521  HGB 8.8* 7.9* 10.2*   Recent Labs    04/24/17 0559 04/24/17 1521  WBC 9.3 11.3*  RBC 2.75* 3.56*  HCT 24.6* 31.7*  PLT 194 221   No results for input(s): NA, K, CL, CO2, BUN, CREATININE, GLUCOSE, CALCIUM in the last 72 hours. No results for input(s): LABPT, INR in the last 72 hours.  Dressig is dry.  Assessment/Plan: 3 Days Post-Op Procedure(s) (LRB): ANTERIOR LATERAL LUMBAR FUSION (XLIF) with posterior spinal fusion interbody L3-5 (N/A) Up with therapy.Awaiting Rehab.  Shelia [name] 04/25/2017, 8:51 AM

## 2017-04-25 NOTE — Progress Notes (Addendum)
CSW spoke with RN this morning regarding discharge plan for this patient. Patient is to return to [facility] at discharge, patient is ready but no order or summary yet placed. Per RN, she will speak with provider and have appropriate orders placed and completed so transportation can be arranged.  Madilyn Fireman, MSW, LCSW-A Weekend Clinical Social Worker [phone]

## 2017-04-26 NOTE — Progress Notes (Signed)
Subjective: 4 Days Post-Op Procedure(s) (LRB): ANTERIOR LATERAL LUMBAR FUSION (XLIF) with posterior spinal fusion interbody L3-5 (N/A) Patient reports pain as severe.  Particularly bad this morning.  Just received IV pain medication.  Leg weakness without any significant change.  Pt still requiring assistance to get out of bed.  Bed available at [facility] when pt ready for discharge.  Objective: Vital signs in last 24 hours: Temp:  [98.5 F (36.9 C)-99.8 F (37.7 C)] 98.5 F (36.9 C) (02/24 0313) Pulse Rate:  [85-95] 87 (02/24 0313) Resp:  [14-18] 18 (02/24 0313) BP: (115-136)/(57-85) 129/68 (02/24 0313) SpO2:  [97 %-100 %] 97 % (02/24 0313)  Intake/Output from previous day: 02/23 0701 - 02/24 0700 In: 4555 [P.O.:220; I.V.:4335] Out: -  Intake/Output this shift: No intake/output data recorded.  Recent Labs    04/23/17 1135 04/24/17 0559 04/24/17 1521  HGB 8.8* 7.9* 10.2*   Recent Labs    04/24/17 0559 04/24/17 1521  WBC 9.3 11.3*  RBC 2.75* 3.56*  HCT 24.6* 31.7*  PLT 194 221   No results for input(s): NA, K, CL, CO2, BUN, CREATININE, GLUCOSE, CALCIUM in the last 72 hours. No results for input(s): LABPT, INR in the last 72 hours.  PE:  wn wd woman in nad.  TLS Brace in place.  Assessment/Plan: 4 Days Post-Op Procedure(s) (LRB): ANTERIOR LATERAL LUMBAR FUSION (XLIF) with posterior spinal fusion interbody L3-5 (N/A) Discharge to SNF Monday.  An additional day is required in the hospital due to pain control and additional PT for independent ambulation.    Wylene Simmer 04/26/2017, 9:06 AM

## 2017-04-27 DIAGNOSIS — K5900 Constipation, unspecified: Secondary | ICD-10-CM | POA: Diagnosis not present

## 2017-04-27 DIAGNOSIS — R488 Other symbolic dysfunctions: Secondary | ICD-10-CM | POA: Diagnosis not present

## 2017-04-27 DIAGNOSIS — G4700 Insomnia, unspecified: Secondary | ICD-10-CM | POA: Diagnosis not present

## 2017-04-27 DIAGNOSIS — Z4889 Encounter for other specified surgical aftercare: Secondary | ICD-10-CM | POA: Diagnosis not present

## 2017-04-27 DIAGNOSIS — R278 Other lack of coordination: Secondary | ICD-10-CM | POA: Diagnosis not present

## 2017-04-27 DIAGNOSIS — F419 Anxiety disorder, unspecified: Secondary | ICD-10-CM | POA: Diagnosis not present

## 2017-04-27 DIAGNOSIS — M6281 Muscle weakness (generalized): Secondary | ICD-10-CM | POA: Diagnosis not present

## 2017-04-27 DIAGNOSIS — R2689 Other abnormalities of gait and mobility: Secondary | ICD-10-CM | POA: Diagnosis not present

## 2017-04-27 DIAGNOSIS — M4320 Fusion of spine, site unspecified: Secondary | ICD-10-CM | POA: Diagnosis not present

## 2017-04-27 DIAGNOSIS — M549 Dorsalgia, unspecified: Secondary | ICD-10-CM | POA: Diagnosis not present

## 2017-04-27 DIAGNOSIS — M545 Low back pain: Secondary | ICD-10-CM | POA: Diagnosis not present

## 2017-04-27 DIAGNOSIS — I10 Essential (primary) hypertension: Secondary | ICD-10-CM | POA: Diagnosis not present

## 2017-04-27 DIAGNOSIS — Z9889 Other specified postprocedural states: Secondary | ICD-10-CM | POA: Diagnosis not present

## 2017-04-27 DIAGNOSIS — M4326 Fusion of spine, lumbar region: Secondary | ICD-10-CM | POA: Diagnosis not present

## 2017-04-27 DIAGNOSIS — K589 Irritable bowel syndrome without diarrhea: Secondary | ICD-10-CM | POA: Diagnosis not present

## 2017-04-27 MED ORDER — LUBIPROSTONE 24 MCG PO CAPS
24.0000 ug | ORAL_CAPSULE | Freq: Two times a day (BID) | ORAL | 0 refills | Status: DC
Start: 2017-04-27 — End: 2017-08-12

## 2017-04-27 MED ORDER — FERROUS SULFATE 325 (65 FE) MG PO TABS: 325.0000 mg | ORAL_TABLET | Freq: Every day | ORAL | 0 refills | Status: DC

## 2017-04-27 NOTE — Progress Notes (Signed)
Physical Therapy Treatment Patient Details Name: Shelia [name] MRN: [id number] DOB: 28-Dec-1945 Today's Date: 04/27/2017    History of Present Illness Pt is a 72 y/o F now s/p Lateral interbody fusion L3-4 and L4-5.  Posterior pedicle screw fixation (segmental) L3-L5. PMHx includes depression, GERD, hyperlipidemia, HTN, and hx of multiple spinal surgeries     PT Comments    Pt very pleasant able to state all precautions and increasing mobility well. Pt educated for transfers, gait and progression with education for positioning in bed and at toilet. Will continue to follow.     Follow Up Recommendations  SNF;Supervision/Assistance - 24 hour     Equipment Recommendations       Recommendations for Other Services       Precautions / Restrictions Precautions Precautions: Back Precaution Booklet Issued: Yes (comment) Required Braces or Orthoses: Spinal Brace Spinal Brace: Lumbar corset;Applied in sitting position    Mobility  Bed Mobility Overal bed mobility: Needs Assistance Bed Mobility: Rolling;Sidelying to Sit;Sit to Sidelying Rolling: Supervision Sidelying to sit: Min assist     Sit to sidelying: Min guard General bed mobility comments: cues for sequence to prevent twisting, assist to fully elevate trunk from surface, increased time and cues to return to bed  Transfers Overall transfer level: Needs assistance   Transfers: Sit to/from Stand Sit to Stand: Min guard         General transfer comment: cues for hand placement and safety x 2 trials  Ambulation/Gait Ambulation/Gait assistance: Min guard Ambulation Distance (Feet): 300 Feet Assistive device: Rolling walker (2 wheeled) Gait Pattern/deviations: Step-through pattern;Decreased stride length   Gait velocity interpretation: Below normal speed for age/gender General Gait Details: pt with good posture and use of RW with cues to release pressure on RW   Stairs            Wheelchair Mobility     Modified Rankin (Stroke Patients Only)       Balance     Sitting balance-Leahy Scale: Good       Standing balance-Leahy Scale: Fair                              Cognition Arousal/Alertness: Awake/alert Behavior During Therapy: WFL for tasks assessed/performed Overall Cognitive Status: Within Functional Limits for tasks assessed                                        Exercises      General Comments        Pertinent Vitals/Pain Pain Assessment: No/denies pain    Home Living                      Prior Function            PT Goals (current goals can now be found in the care plan section) Progress towards PT goals: Progressing toward goals    Frequency           PT Plan Current plan remains appropriate    Co-evaluation              AM-PAC PT "6 Clicks" Daily Activity  Outcome Measure  Difficulty turning over in bed (including adjusting bedclothes, sheets and blankets)?: A Little Difficulty moving from lying on back to sitting on the side of the bed? : Unable Difficulty sitting down  on and standing up from a chair with arms (e.g., wheelchair, bedside commode, etc,.)?: A Little Help needed moving to and from a bed to chair (including a wheelchair)?: A Little Help needed walking in hospital room?: A Little Help needed climbing 3-5 steps with a railing? : A Little 6 Click Score: 16    End of Session Equipment Utilized During Treatment: Back brace Activity Tolerance: Patient tolerated treatment well Patient left: in bed;with call bell/phone within reach Nurse Communication: Mobility status PT Visit Diagnosis: Other abnormalities of gait and mobility (R26.89)     Time: 1036-1100 PT Time Calculation (min) (ACUTE ONLY): 24 min  Charges:  $Gait Training: 8-22 mins $Therapeutic Activity: 8-22 mins                    G Codes:       Elwyn Reach, PT [phone]    [place] 04/27/2017, 11:10  AM

## 2017-04-27 NOTE — Progress Notes (Signed)
1950: [facility] arrived to transport pt. IV dc'ed. Pt with questions about trazodone to replace ambien sleep aid at [facility]. Called [facility] to give report and relay message about trazodone request. Pt left with [facility], husband at side, and all belongings. VS stable. No s/s of distress.

## 2017-04-27 NOTE — Progress Notes (Signed)
Called Dr. Rolena Infante office. No discharge orders are in Epic. Patient has eaten lunch and is ready for transport upon discharge order. Prescriptions for medications are available and in discharge packet.

## 2017-04-27 NOTE — Discharge Summary (Signed)
Physician Discharge Summary  Patient ID: Shelia [name] MRN: [id number] DOB/AGE: [birth year] 72 y.o.  Admit date: 04/22/2017 Discharge date: 04/27/2017  Admission Diagnoses:  Lumbar Degenerative Disc Disease with Radiculopathy  Discharge Diagnoses:  Active Problems:   S/P lumbar fusion   Past Medical History:  Diagnosis Date  . Anxiety   . Cystitis   . Depression   . DJD (degenerative joint disease)   . GERD (gastroesophageal reflux disease)   . Hyperlipidemia   . Hypertension   . Irritable bowel syndrome (IBS)   . PONV (postoperative nausea and vomiting)    with gallbladder surgery 2014  . Spinal stenosis     Surgeries: Procedure(s): ANTERIOR LATERAL LUMBAR FUSION (XLIF) with posterior spinal fusion interbody L3-5 on 04/22/2017   Consultants (if any):   Discharged Condition: Improved  Hospital Course: Shelia [name] is an 72 y.o. female who was admitted 04/22/2017 with a diagnosis of Lumbar Degenerative Disc disease and went to the operating room on 04/22/2017 and underwent the above named procedures. Post op pts pain is primarily incisional with Percocet controlling.  Pt has been ambulating with family and PT. Pt hgb dropped post op and 1 unit of RBCs was given on Friday. Pts husband is still working and other family is out of town.  Rehab was ordered.  Pt was not a candidate for Inpatient Rehab.  Pt has chosen to go to [facility] for continued rehab.  Pt has not has a BM yet.  Amitiza was ordered.  Dr. Rolena Infante also ordered Fe.  We will see the pt next week in clinic for wound assessment and suture removal.    She was given perioperative antibiotics:  Anti-infectives (From admission, onward)   Start     Dose/Rate Route Frequency Ordered Stop   04/22/17 1615  ceFAZolin (ANCEF) IVPB 2g/100 mL premix     2 g 200 mL/hr over 30 Minutes Intravenous Every 8 hours 04/22/17 1605 04/23/17 0000   04/22/17 0710  ceFAZolin (ANCEF) 2-4 GM/100ML-% IVPB    Comments:  Maryjean Ka    : cabinet override      04/22/17 0710 04/22/17 1914   04/22/17 0705  ceFAZolin (ANCEF) IVPB 2g/100 mL premix     2 g 200 mL/hr over 30 Minutes Intravenous 30 min pre-op 04/22/17 0705 04/22/17 1324    .  She was given sequential compression devices, early ambulation, and TED for DVT prophylaxis.  She benefited maximally from the hospital stay and there were no complications.    Recent vital signs:  Vitals:   04/27/17 0341 04/27/17 0800  BP: 121/72 (!) 108/55  Pulse: 87 96  Resp: 18 16  Temp: 98.4 F (36.9 C) 99 F (37.2 C)  SpO2: 96% 99%    Recent laboratory studies:  Lab Results  Component Value Date   HGB 10.2 (L) 04/24/2017   HGB 7.9 (L) 04/24/2017   HGB 8.8 (L) 04/23/2017   Lab Results  Component Value Date   WBC 11.3 (H) 04/24/2017   PLT 221 04/24/2017   No results found for: INR Lab Results  Component Value Date   NA 140 04/16/2017   K 3.3 (L) 04/16/2017   CL 101 04/16/2017   CO2 26 04/16/2017   BUN 13 04/16/2017   CREATININE 1.21 (H) 04/16/2017   GLUCOSE 101 (H) 04/16/2017    Discharge Medications:   Allergies as of 04/27/2017      Reactions   No Known Allergies       Medication List  STOP taking these medications   aspirin EC 81 MG tablet   celecoxib 200 MG capsule Commonly known as:  CELEBREX   losartan 100 MG tablet Commonly known as:  COZAAR   methocarbamol 500 MG tablet Commonly known as:  ROBAXIN   zolpidem 10 MG tablet Commonly known as:  AMBIEN     TAKE these medications   baclofen 10 MG tablet Commonly known as:  LIORESAL Take 1 tablet (10 mg total) by mouth 3 (three) times daily.   cholecalciferol 1000 units tablet Commonly known as:  VITAMIN D Take 1,000 Units by mouth daily.   ferrous sulfate 325 (65 FE) MG tablet Take 1 tablet (325 mg total) by mouth daily.   fish oil-omega-3 fatty acids 1000 MG capsule Take 1 g by mouth daily.   fluticasone 50 MCG/ACT nasal spray Commonly known as:  FLONASE Place 2 sprays  into both nostrils daily as needed for allergies or rhinitis.   hydrochlorothiazide 25 MG tablet Commonly known as:  HYDRODIURIL Take 12.5-25 mg by mouth daily. Dose varies based on fluid retention/blood pressure.   HYDROcodone-acetaminophen 10-325 MG tablet Commonly known as:  NORCO Take 1 tablet by mouth 3 (three) times daily as needed (typically morning & midday). For pain.   lubiprostone 24 MCG capsule Commonly known as:  AMITIZA Take 1 capsule (24 mcg total) by mouth 2 (two) times daily with a meal. Prn constipation   ondansetron 4 MG disintegrating tablet Commonly known as:  ZOFRAN ODT Take 1 tablet (4 mg total) by mouth every 8 (eight) hours as needed for nausea or vomiting.   oxyCODONE-acetaminophen 10-325 MG tablet Commonly known as:  PERCOCET Take 1 tablet by mouth every 4 (four) hours as needed for pain.       Diagnostic Studies: Dg Lumbar Spine 2-3 Views  Result Date: 04/23/2017 CLINICAL DATA:  Postop back surgery. EXAM: LUMBAR SPINE - 2-3 VIEW COMPARISON:  04/22/2017. 09/12/2016. FINDINGS: Status post PLIF L3 through L5. Bilateral pedicle screws and posterior rods are in place. Interbody spacers are in place. There is some superior endplate deformity at L4 that was not visible on the radiographs of 2018 and could possibly be a minor superior endplate fracture. There is also a small lucency at the anterior inferior aspect of the L4 vertebral body that could indicate a minor inferior endplate fracture. Distant interbody fusion at L5-S1. IMPRESSION: Recently performed PLIF L3 through L5. Question minimal endplate fractures at L4 secondary to insufficiency. Electronically Signed   By: Nelson Chimes M.D.   On: 04/23/2017 09:25   Dg Lumbar Spine 2-3 Views  Result Date: 04/22/2017 CLINICAL DATA:  L3-L5 XLIF. EXAM: DG C-ARM 61-120 MIN; LUMBAR SPINE - 2-3 VIEW COMPARISON:  Lumbar spine x-rays dated September 12, 2016. FINDINGS: Intraoperative x-rays demonstrate interval L3-L5 posterior  and interbody fusion. New slight compression deformity of the L4 superior endplate. Trace anterolisthesis at L4-L5, improved when compared to prior study. IMPRESSION: 1. Intraoperative x-rays for L3-L5 fusion. New slight compression deformity of the L4 superior endplate. FLUOROSCOPY TIME:  7 minutes, 56 seconds. C-arm fluoroscopic images were obtained intraoperatively and submitted for post operative interpretation. Electronically Signed   By: Titus Dubin M.D.   On: 04/22/2017 13:23   Dg C-arm 1-60 Min  Result Date: 04/22/2017 CLINICAL DATA:  L3-L5 XLIF. EXAM: DG C-ARM 61-120 MIN; LUMBAR SPINE - 2-3 VIEW COMPARISON:  Lumbar spine x-rays dated September 12, 2016. FINDINGS: Intraoperative x-rays demonstrate interval L3-L5 posterior and interbody fusion. New slight compression deformity of the  L4 superior endplate. Trace anterolisthesis at L4-L5, improved when compared to prior study. IMPRESSION: 1. Intraoperative x-rays for L3-L5 fusion. New slight compression deformity of the L4 superior endplate. FLUOROSCOPY TIME:  7 minutes, 56 seconds. C-arm fluoroscopic images were obtained intraoperatively and submitted for post operative interpretation. Electronically Signed   By: Titus Dubin M.D.   On: 04/22/2017 13:23   Dg C-arm 1-60 Min  Result Date: 04/22/2017 CLINICAL DATA:  L3-L5 XLIF. EXAM: DG C-ARM 61-120 MIN; LUMBAR SPINE - 2-3 VIEW COMPARISON:  Lumbar spine x-rays dated September 12, 2016. FINDINGS: Intraoperative x-rays demonstrate interval L3-L5 posterior and interbody fusion. New slight compression deformity of the L4 superior endplate. Trace anterolisthesis at L4-L5, improved when compared to prior study. IMPRESSION: 1. Intraoperative x-rays for L3-L5 fusion. New slight compression deformity of the L4 superior endplate. FLUOROSCOPY TIME:  7 minutes, 56 seconds. C-arm fluoroscopic images were obtained intraoperatively and submitted for post operative interpretation. Electronically Signed   By: Titus Dubin  M.D.   On: 04/22/2017 13:23   Dg C-arm 1-60 Min  Result Date: 04/22/2017 CLINICAL DATA:  L3-L5 XLIF. EXAM: DG C-ARM 61-120 MIN; LUMBAR SPINE - 2-3 VIEW COMPARISON:  Lumbar spine x-rays dated September 12, 2016. FINDINGS: Intraoperative x-rays demonstrate interval L3-L5 posterior and interbody fusion. New slight compression deformity of the L4 superior endplate. Trace anterolisthesis at L4-L5, improved when compared to prior study. IMPRESSION: 1. Intraoperative x-rays for L3-L5 fusion. New slight compression deformity of the L4 superior endplate. FLUOROSCOPY TIME:  7 minutes, 56 seconds. C-arm fluoroscopic images were obtained intraoperatively and submitted for post operative interpretation. Electronically Signed   By: Titus Dubin M.D.   On: 04/22/2017 13:23    Disposition: 01-Home or Self Care Pt will go to [facility] Pt will present to clinic 2 weeks post op Post op meds on chart to go with the pt Discharge Instructions    Incentive spirometry RT   Complete by:  As directed        Contact information for follow-up providers    Melina Schools, MD Follow up in 2 week(s).   Specialty:  Orthopedic Surgery Contact information: [address] [place] [address] [phone]            Contact information for after-discharge care    Destination    [facility] .   Service:  Skilled Nursing Contact information: [place] [phone]                   Signed: Valinda Hoar 04/27/2017, 12:17 PM

## 2017-04-27 NOTE — Progress Notes (Addendum)
[place] [phone]) twice to give report to Nurse receiving patient. Nurse is not available either time. Norma-secretary answered both times. Will try again before transportation arrives.

## 2017-04-27 NOTE — Progress Notes (Signed)
Spoke to [place], about attempts to reach [employer]. CSW stated she will call for transportation. Patient discharge is complete, had no questions, packet in room waiting for transportation.

## 2017-04-27 NOTE — Clinical Social Work Placement (Signed)
   CLINICAL SOCIAL WORK PLACEMENT  NOTE  Date:  04/27/2017  Patient Details  Name: Shelia [name] MRN: [id number] Date of Birth: 16-Oct-1945  Clinical Social Work is seeking post-discharge placement for this patient at the [place] level of care (*CSW will initial, date and re-position this form in  chart as items are completed):      Patient/family provided with [place] Work Department's list of facilities offering this level of care within the geographic area requested by the patient (or if unable, by the patient's family).  Yes   Patient/family informed of their freedom to choose among providers that offer the needed level of care, that participate in Medicare, Medicaid or managed care program needed by the patient, have an available bed and are willing to accept the patient.      Patient/family informed of [facility]'s ownership interest in [facility] and [facility], as well as of the fact that they are under no obligation to receive care at these facilities.  PASRR submitted to EDS on       PASRR number received on 04/24/17     Existing PASRR number confirmed on       FL2 transmitted to all facilities in geographic area requested by pt/family on 04/24/17     FL2 transmitted to all facilities within larger geographic area on       Patient informed that his/her managed care company has contracts with or will negotiate with certain facilities, including the following:        Yes   Patient/family informed of bed offers received.  Patient [facility] bed at [facility]     Physician recommends and patient chooses bed at      Patient to be transferred to [facility] on 04/27/17.  Patient to be transferred to facility by [facility]     Patient family notified on 04/27/17 of transfer.  Name of family member notified:  Bill     PHYSICIAN       Additional Comment:    _______________________________________________ Eileen Stanford,  LCSW 04/27/2017, 12:30 PM

## 2017-04-27 NOTE — Progress Notes (Signed)
Called a third time to give report to facility. Constance Holster answered the phone again to transfer. No answer.

## 2017-04-27 NOTE — Care Management Important Message (Signed)
Important Message  Patient Details  Name: Shelia [name] MRN: [id number] Date of Birth: 05-Apr-1945   Medicare Important Message Given:       Orbie Pyo 04/27/2017, 11:57 AM

## 2017-04-27 NOTE — Progress Notes (Signed)
    Subjective: Procedure(s) (LRB): ANTERIOR LATERAL LUMBAR FUSION (XLIF) with posterior spinal fusion interbody L3-5 (N/A) 5 Days Post-Op  Patient reports pain as 4 on 0-10 scale.  Reports unchanged leg pain reports incisional back pain   Positive void Negative bowel movement Positive flatus Negative chest pain or shortness of breath  Objective: Vital signs in last 24 hours: Temp:  [98.4 F (36.9 C)-99.5 F (37.5 C)] 98.4 F (36.9 C) (02/25 0341) Pulse Rate:  [86-90] 87 (02/25 0341) Resp:  [18-20] 18 (02/25 0341) BP: (108-134)/(52-72) 121/72 (02/25 0341) SpO2:  [96 %-100 %] 96 % (02/25 0341)  Intake/Output from previous day: 02/24 0701 - 02/25 0700 In: 720 [P.O.:720] Out: 6 [Urine:6]  Labs: Recent Labs    04/24/17 1521  WBC 11.3*  RBC 3.56*  HCT 31.7*  PLT 221   No results for input(s): NA, K, CL, CO2, BUN, CREATININE, GLUCOSE, CALCIUM in the last 72 hours. No results for input(s): LABPT, INR in the last 72 hours.  Physical Exam: ABD soft Intact pulses distally Incision: dressing C/D/I Compartment soft Body mass index is 27.38 kg/m.   Assessment/Plan: Patient stable  xrays satisfactory Continue mobilization with physical therapy Continue care  Up with therapy  Plan on d/c to SNF today 1. Left foot drop: no improvement.  Continue AFO 2. Right hip flexor weakness - most likely pain inhibition.  Strength and pain have improved since surgery 3. Ambulating with walker - improving 4. Constipation: will give enema and mag citrate at SNF  5. Post-op anemia: resolved with transfusion.  Will continue iron supplements 6. F/u with me in late next week for wound check  Melina Schools, MD [place] [phone]

## 2017-04-27 NOTE — Clinical Social Work Note (Signed)
[facility] prepared to take pt today--pending d/c summary.   [place], [place]

## 2017-04-27 NOTE — Progress Notes (Addendum)
Called [facility]) @ [phone] and @ [phone] for transportation for patient. No answer at either number. CSW notes that [facility] was supposed to pick up patient circa 1530, however discharge order was not in.

## 2017-04-27 NOTE — Clinical Social Work Note (Signed)
Clinical Social Worker facilitated patient discharge including contacting patient family and facility to confirm patient discharge plans.  Clinical information faxed to facility and family agreeable with plan.  CSW arranged ambulance transport via [facility] to [place] (3:30).  RN to call [phone] for report prior to discharge.  Clinical Social Worker will sign off for now as social work intervention is no longer needed. Please consult [place] again if new need arises.  [place], [place]

## 2017-04-28 DIAGNOSIS — Z9889 Other specified postprocedural states: Secondary | ICD-10-CM | POA: Diagnosis not present

## 2017-04-28 DIAGNOSIS — R2689 Other abnormalities of gait and mobility: Secondary | ICD-10-CM | POA: Diagnosis not present

## 2017-04-28 DIAGNOSIS — K5900 Constipation, unspecified: Secondary | ICD-10-CM | POA: Diagnosis not present

## 2017-04-28 DIAGNOSIS — I10 Essential (primary) hypertension: Secondary | ICD-10-CM | POA: Diagnosis not present

## 2017-04-28 DIAGNOSIS — G4700 Insomnia, unspecified: Secondary | ICD-10-CM | POA: Diagnosis not present

## 2017-04-28 DIAGNOSIS — F419 Anxiety disorder, unspecified: Secondary | ICD-10-CM | POA: Diagnosis not present

## 2017-04-28 DIAGNOSIS — M545 Low back pain: Secondary | ICD-10-CM | POA: Diagnosis not present

## 2017-04-30 DIAGNOSIS — R2689 Other abnormalities of gait and mobility: Secondary | ICD-10-CM | POA: Diagnosis not present

## 2017-04-30 DIAGNOSIS — K5900 Constipation, unspecified: Secondary | ICD-10-CM | POA: Diagnosis not present

## 2017-04-30 DIAGNOSIS — M545 Low back pain: Secondary | ICD-10-CM | POA: Diagnosis not present

## 2017-05-01 DIAGNOSIS — K589 Irritable bowel syndrome without diarrhea: Secondary | ICD-10-CM | POA: Diagnosis not present

## 2017-05-01 DIAGNOSIS — Z9889 Other specified postprocedural states: Secondary | ICD-10-CM | POA: Diagnosis not present

## 2017-05-04 DIAGNOSIS — K5900 Constipation, unspecified: Secondary | ICD-10-CM | POA: Diagnosis not present

## 2017-05-04 DIAGNOSIS — M545 Low back pain: Secondary | ICD-10-CM | POA: Diagnosis not present

## 2017-05-04 DIAGNOSIS — R2689 Other abnormalities of gait and mobility: Secondary | ICD-10-CM | POA: Diagnosis not present

## 2017-05-07 DIAGNOSIS — M545 Low back pain: Secondary | ICD-10-CM | POA: Diagnosis not present

## 2017-05-07 DIAGNOSIS — K5900 Constipation, unspecified: Secondary | ICD-10-CM | POA: Diagnosis not present

## 2017-05-07 DIAGNOSIS — R2689 Other abnormalities of gait and mobility: Secondary | ICD-10-CM | POA: Diagnosis not present

## 2017-06-05 DIAGNOSIS — Z4789 Encounter for other orthopedic aftercare: Secondary | ICD-10-CM | POA: Diagnosis not present

## 2017-06-08 DIAGNOSIS — M545 Low back pain: Secondary | ICD-10-CM | POA: Diagnosis not present

## 2017-06-11 DIAGNOSIS — M545 Low back pain: Secondary | ICD-10-CM | POA: Diagnosis not present

## 2017-06-16 DIAGNOSIS — M545 Low back pain: Secondary | ICD-10-CM | POA: Diagnosis not present

## 2017-06-18 DIAGNOSIS — M545 Low back pain: Secondary | ICD-10-CM | POA: Diagnosis not present

## 2017-06-23 DIAGNOSIS — M545 Low back pain: Secondary | ICD-10-CM | POA: Diagnosis not present

## 2017-06-26 DIAGNOSIS — M545 Low back pain: Secondary | ICD-10-CM | POA: Diagnosis not present

## 2017-07-09 DIAGNOSIS — M545 Low back pain: Secondary | ICD-10-CM | POA: Diagnosis not present

## 2017-07-13 DIAGNOSIS — Z09 Encounter for follow-up examination after completed treatment for conditions other than malignant neoplasm: Secondary | ICD-10-CM | POA: Diagnosis not present

## 2017-07-14 DIAGNOSIS — M545 Low back pain: Secondary | ICD-10-CM | POA: Diagnosis not present

## 2017-07-16 DIAGNOSIS — M545 Low back pain: Secondary | ICD-10-CM | POA: Diagnosis not present

## 2017-07-17 DIAGNOSIS — R05 Cough: Secondary | ICD-10-CM | POA: Diagnosis not present

## 2017-07-17 DIAGNOSIS — M4800 Spinal stenosis, site unspecified: Secondary | ICD-10-CM | POA: Diagnosis not present

## 2017-07-17 DIAGNOSIS — Z6826 Body mass index (BMI) 26.0-26.9, adult: Secondary | ICD-10-CM | POA: Diagnosis not present

## 2017-07-17 DIAGNOSIS — I10 Essential (primary) hypertension: Secondary | ICD-10-CM | POA: Diagnosis not present

## 2017-07-21 DIAGNOSIS — M545 Low back pain: Secondary | ICD-10-CM | POA: Diagnosis not present

## 2017-07-28 DIAGNOSIS — M545 Low back pain: Secondary | ICD-10-CM | POA: Diagnosis not present

## 2017-07-29 DIAGNOSIS — R82998 Other abnormal findings in urine: Secondary | ICD-10-CM | POA: Diagnosis not present

## 2017-07-29 DIAGNOSIS — I10 Essential (primary) hypertension: Secondary | ICD-10-CM | POA: Diagnosis not present

## 2017-07-29 DIAGNOSIS — M859 Disorder of bone density and structure, unspecified: Secondary | ICD-10-CM | POA: Diagnosis not present

## 2017-07-29 DIAGNOSIS — E7849 Other hyperlipidemia: Secondary | ICD-10-CM | POA: Diagnosis not present

## 2017-08-06 ENCOUNTER — Ambulatory Visit
Admission: RE | Admit: 2017-08-06 | Discharge: 2017-08-06 | Disposition: A | Discharge status: Home / Self Care | Payer: Medicare Other | Source: Ambulatory Visit | Attending: Internal Medicine | Admitting: Internal Medicine

## 2017-08-06 ENCOUNTER — Other Ambulatory Visit: Payer: Self-pay | Admitting: Internal Medicine

## 2017-08-06 DIAGNOSIS — K589 Irritable bowel syndrome without diarrhea: Secondary | ICD-10-CM | POA: Diagnosis not present

## 2017-08-06 DIAGNOSIS — M48061 Spinal stenosis, lumbar region without neurogenic claudication: Secondary | ICD-10-CM | POA: Diagnosis not present

## 2017-08-06 DIAGNOSIS — R0609 Other forms of dyspnea: Secondary | ICD-10-CM

## 2017-08-06 DIAGNOSIS — I10 Essential (primary) hypertension: Secondary | ICD-10-CM | POA: Diagnosis not present

## 2017-08-06 DIAGNOSIS — I872 Venous insufficiency (chronic) (peripheral): Secondary | ICD-10-CM | POA: Diagnosis not present

## 2017-08-06 DIAGNOSIS — Z0000 Encounter for general adult medical examination without abnormal findings: Secondary | ICD-10-CM | POA: Diagnosis not present

## 2017-08-06 DIAGNOSIS — R0901 Asphyxia: Secondary | ICD-10-CM

## 2017-08-06 DIAGNOSIS — Z1389 Encounter for screening for other disorder: Secondary | ICD-10-CM | POA: Diagnosis not present

## 2017-08-06 DIAGNOSIS — E7849 Other hyperlipidemia: Secondary | ICD-10-CM | POA: Diagnosis not present

## 2017-08-06 DIAGNOSIS — K219 Gastro-esophageal reflux disease without esophagitis: Secondary | ICD-10-CM | POA: Diagnosis not present

## 2017-08-06 DIAGNOSIS — N183 Chronic kidney disease, stage 3 (moderate): Secondary | ICD-10-CM | POA: Diagnosis not present

## 2017-08-06 DIAGNOSIS — Z6824 Body mass index (BMI) 24.0-24.9, adult: Secondary | ICD-10-CM | POA: Diagnosis not present

## 2017-08-06 MED ORDER — IOPAMIDOL (ISOVUE-370) INJECTION 76%
75.0000 mL | Freq: Once | INTRAVENOUS | Status: AC | PRN
  Administered 2017-08-06: 75 mL via INTRAVENOUS

## 2017-08-11 ENCOUNTER — Telehealth: Payer: Self-pay | Admitting: Pulmonary Disease

## 2017-08-11 NOTE — Telephone Encounter (Signed)
Left a message for patient to call back to get scheduled with Dr. Elsworth Soho for 08/12/17. I have held spots on his schedule.

## 2017-08-11 NOTE — Telephone Encounter (Signed)
Pt returned call. New Pt Consult sched with pt for 6/12 at 10:30.

## 2017-08-12 ENCOUNTER — Encounter: Payer: Self-pay | Admitting: Pulmonary Disease

## 2017-08-12 ENCOUNTER — Ambulatory Visit (INDEPENDENT_AMBULATORY_CARE_PROVIDER_SITE_OTHER): Payer: Medicare Other | Admitting: Pulmonary Disease

## 2017-08-12 DIAGNOSIS — R9389 Abnormal findings on diagnostic imaging of other specified body structures: Secondary | ICD-10-CM | POA: Diagnosis not present

## 2017-08-12 DIAGNOSIS — K219 Gastro-esophageal reflux disease without esophagitis: Secondary | ICD-10-CM

## 2017-08-12 MED ORDER — PANTOPRAZOLE SODIUM 40 MG PO TBEC: 40.0000 mg | DELAYED_RELEASE_TABLET | Freq: Every day | ORAL | 0 refills | Status: DC

## 2017-08-12 NOTE — Assessment & Plan Note (Addendum)
CT scan findings may have been related to infection from your sinus or reflux aspiration. You had a relatively normal scan in December 2018. Doubt MAC infection, doubt ILD Hypoxia seems to have been transient On ambulation which is now resolved  Complete course of antibiotic  We will plan on repeat CT scan in 4 to 6 months

## 2017-08-12 NOTE — Progress Notes (Signed)
Subjective:    Patient ID: Shelia [name], female    DOB: 20-Oct-1945, 72 y.o.   MRN: [id number]  HPI  Chief Complaint  Patient presents with  . Pulm Consult    Referred by Dr. Dagmar Hait from [facility] for SOB. Per patient, she gets SOB with activity. Cough. Patient was walked at Dr. Danna Hefty office, O2 was low afterwards. Had a CT scan last week.      72 year old never smoker referred for evaluation of abnormal CT scan.  She reports back surgery in 08/2016 and then again in 04/2017 by Dr. Rolena Infante in a weight loss of 20 pounds since then and she is recovering with physical therapy.  She had hoarseness of voice after surgery which she attributes to intubation which recovered within a few days. About 3 weeks ago she developed sinus infection followed by a cough productive of clear thick mucus she was initially given a Z-Pak and cough medicine.  She then went to her PCP for routine physical and was noted to be short of breath, she was found to desaturate to 88% on ambulation. EKG was normal. CT angiogram chest was obtained 08/2017 which showed no evidence of pulmonary embolism but showed scattered tree-in-bud nodularity on the right lung predominantly in the right lower lobe, mild infection was favored. Of note CT angiogram from 01/2017 for similar shortness of breath was reviewed which shows bibasilar atelectasis, small hiatal hernia and mildly prominent subcarinal lymph node No evidence of ILD on either scans.  She reports that coughing has subsided mild shortness of breath persists especially in physical therapy. She reports significant heartburn that bothers her every night, her husband has to run for an antacid, occasionally she will take a carbonated drink and takes few minutes for the episode to subside.  She is a retired Scientist, physiological from [employer]  Prior history of hysterectomy and cholecystectomy  On ambulation, oxygen saturation stayed at 97% after walking 3 laps  in the office       Past Medical History:  Diagnosis Date  . Anxiety   . Cystitis   . Depression   . DJD (degenerative joint disease)   . GERD (gastroesophageal reflux disease)   . Hyperlipidemia   . Hypertension   . Irritable bowel syndrome (IBS)   . PONV (postoperative nausea and vomiting)    with gallbladder surgery 2014  . Spinal stenosis      Past Surgical History:  Procedure Laterality Date  . ABDOMINAL HYSTERECTOMY  1981   [facility]  . ANTERIOR LAT LUMBAR FUSION N/A 04/22/2017   Procedure: ANTERIOR LATERAL LUMBAR FUSION (XLIF) with posterior spinal fusion interbody L3-5;  Surgeon: Melina Schools, MD;  Location: [facility];  Service: Orthopedics;  Laterality: N/A;  6 hrs  . [place]   l-5,s-1  . BACK SURGERY  2000   l-5,s-1  . BACK SURGERY  2002   l-5,s-1  . BACK SURGERY  2003   l-5,s-2  . [place]   c-4,c-5  . [place]   c-4,c-5  . CHOLECYSTECTOMY     lap  . COLONOSCOPY  2008   polyps  . hemorrhoid banding  2011  . LUMBAR LAMINECTOMY/DECOMPRESSION MICRODISCECTOMY N/A 09/11/2016   Procedure: Lumbar three-five decompression, insitu fusion Lumbar fhree-five,  removal of lumbar hardware Lumbar five-Sacral one;  Surgeon: Melina Schools, MD;  Location: [facility];  Service: Orthopedics;  Laterality: N/A;  3.5 hrs     No Known Allergies  Social History   Socioeconomic History  . Marital status: Married    Spouse name: Not on file  . Number of children: Not on file  . Years of education: Not on file  . Highest education level: Not on file  Occupational History  . Not on file  Social Needs  . Financial resource strain: Not on file  . Food insecurity:    Worry: Not on file    Inability: Not on file  . Transportation needs:    Medical: Not on file    Non-medical: Not on file  Tobacco Use  . Smoking status: Never Smoker  . Smokeless tobacco: Never Used  Substance and Sexual Activity  . Alcohol use: No  . Drug  use: No  . Sexual activity: Yes    Partners: Male    Birth control/protection: Surgical  Lifestyle  . Physical activity:    Days per week: Not on file    Minutes per session: Not on file  . Stress: Not on file  Relationships  . Social connections:    Talks on phone: Not on file    Gets together: Not on file    Attends religious service: Not on file    Active member of club or organization: Not on file    Attends meetings of clubs or organizations: Not on file    Relationship status: Not on file  . Intimate partner violence:    Fear of current or ex partner: Not on file    Emotionally abused: Not on file    Physically abused: Not on file    Forced sexual activity: Not on file  Other Topics Concern  . Not on file  Social History Narrative  . Not on file     Family History  Problem Relation Age of Onset  . Heart disease Mother   . Heart disease Father   . Hemochromatosis Daughter      Review of Systems  Constitutional: Negative for fever and unexpected weight change.  HENT: Negative for congestion, dental problem, ear pain, nosebleeds, postnasal drip, rhinorrhea, sinus pressure, sneezing, sore throat and trouble swallowing.   Eyes: Negative for redness and itching.  Respiratory: Positive for cough, chest tightness and shortness of breath. Negative for wheezing.   Cardiovascular: Negative for palpitations and leg swelling.  Gastrointestinal: Negative for nausea and vomiting.  Genitourinary: Negative for dysuria.  Musculoskeletal: Negative for joint swelling.  Skin: Negative for rash.  Allergic/Immunologic: Negative.  Negative for environmental allergies, food allergies and immunocompromised state.  Neurological: Negative for headaches.  Hematological: Does not bruise/bleed easily.  Psychiatric/Behavioral: Negative for dysphoric mood. The patient is not nervous/anxious.        Objective:   Physical Exam  Gen. Pleasant, well-nourished, in no distress, anxious  affect ENT - no lesions, no post nasal drip Neck: No JVD, no thyromegaly, no carotid bruits Lungs: no use of accessory muscles, no dullness to percussion, clear without rhonchi , ? Few rales RLL that disappear with coughing Cardiovascular: Rhythm regular, heart sounds  normal, no murmurs or gallops, no peripheral edema Abdomen: soft and non-tender, no hepatosplenomegaly, BS normal. Musculoskeletal: No deformities, no cyanosis or clubbing Neuro:  alert, non focal       Assessment & Plan:

## 2017-08-12 NOTE — Patient Instructions (Signed)
Your CT scan findings may have been related to infection from your sinus or reflux aspiration. You had a relatively normal scan in December 2018.  Complete course of antibiotic Protonix 40 mg once daily for 6 weeks  We discussed other treatment measures for reflux  -Small frequent meals. -Avoid foods that worsen reflux. -Propped up position during sleep. -At least 3 hours between last meal and bedtime  We will plan on repeat CT scan in 4 to 6 months

## 2017-08-12 NOTE — Assessment & Plan Note (Signed)
Protonix 40 mg once daily for 6 weeks  We discussed other treatment measures for reflux  -Small frequent meals. -Avoid foods that worsen reflux. -Propped up position during sleep. -At least 3 hours between last meal and bedtime   If persistent will consider further evaluation

## 2017-08-18 DIAGNOSIS — M545 Low back pain: Secondary | ICD-10-CM | POA: Diagnosis not present

## 2017-08-28 DIAGNOSIS — Z6824 Body mass index (BMI) 24.0-24.9, adult: Secondary | ICD-10-CM | POA: Diagnosis not present

## 2017-08-28 DIAGNOSIS — K219 Gastro-esophageal reflux disease without esophagitis: Secondary | ICD-10-CM | POA: Diagnosis not present

## 2017-08-28 DIAGNOSIS — R069 Unspecified abnormalities of breathing: Secondary | ICD-10-CM | POA: Diagnosis not present

## 2017-08-28 DIAGNOSIS — N183 Chronic kidney disease, stage 3 (moderate): Secondary | ICD-10-CM | POA: Diagnosis not present

## 2017-08-28 DIAGNOSIS — M48061 Spinal stenosis, lumbar region without neurogenic claudication: Secondary | ICD-10-CM | POA: Diagnosis not present

## 2017-08-28 DIAGNOSIS — I10 Essential (primary) hypertension: Secondary | ICD-10-CM | POA: Diagnosis not present

## 2017-08-28 DIAGNOSIS — R0901 Asphyxia: Secondary | ICD-10-CM | POA: Diagnosis not present

## 2017-09-01 DIAGNOSIS — M545 Low back pain: Secondary | ICD-10-CM | POA: Diagnosis not present

## 2017-09-02 DIAGNOSIS — Z1231 Encounter for screening mammogram for malignant neoplasm of breast: Secondary | ICD-10-CM | POA: Diagnosis not present

## 2017-09-15 DIAGNOSIS — M545 Low back pain: Secondary | ICD-10-CM | POA: Diagnosis not present

## 2017-09-17 ENCOUNTER — Ambulatory Visit (INDEPENDENT_AMBULATORY_CARE_PROVIDER_SITE_OTHER): Payer: Medicare Other | Admitting: Pulmonary Disease

## 2017-09-17 ENCOUNTER — Encounter: Payer: Self-pay | Admitting: Pulmonary Disease

## 2017-09-17 DIAGNOSIS — K219 Gastro-esophageal reflux disease without esophagitis: Secondary | ICD-10-CM | POA: Diagnosis not present

## 2017-09-17 DIAGNOSIS — R9389 Abnormal findings on diagnostic imaging of other specified body structures: Secondary | ICD-10-CM | POA: Diagnosis not present

## 2017-09-17 NOTE — Progress Notes (Signed)
   Subjective:    Patient ID: Shelia [name], female    DOB: May 11, 1945, 72 y.o.   MRN: [id number]  HPI  72 year old never smoker  for FU of abnormal CT scan and mild hypoxia noted in PCP office -Showing tree-in-bud findings from 08/2017 which was new compared to 01/2017. She was recovering from a sinus infection and also has severe reflux.  Remote EGD by Dr. Thana Farr. She is placed on Protonix and states that she is 80% improved however some reflux persists especially at night.  Coughing is much improved to. No sputum production  her back hurts when she has paroxysms of cough  On her last visit she ambulated without significant desaturation  Significant tests/ events reviewed  CT angiogram chest 08/2017 which showed no evidence of pulmonary embolism but showed scattered tree-in-bud nodularity on the right lung predominantly in the right lower lobe, mild infection was favored.   CT angiogram from 01/2017 >> bibasilar atelectasis, small hiatal hernia and mildly prominent subcarinal lymph node No evidence of ILD on either scans.    Review of Systems Patient denies significant dyspnea,cough, hemoptysis,  chest pain, palpitations, pedal edema, orthopnea, paroxysmal nocturnal dyspnea, lightheadedness, nausea, vomiting, abdominal or  leg pains      Objective:   Physical Exam  Gen. Pleasant, well-nourished, in no distress ENT - no thrush, no post nasal drip Neck: No JVD, no thyromegaly, no carotid bruits Lungs: no use of accessory muscles, no dullness to percussion, clear without rales or rhonchi  Cardiovascular: Rhythm regular, heart sounds  normal, no murmurs or gallops, no peripheral edema Musculoskeletal: No deformities, no cyanosis or clubbing        Assessment & Plan:

## 2017-09-17 NOTE — Patient Instructions (Signed)
CT chest without contrast in December 2019.  For reflux, try taking Protonix around lunchtime. This does not work in 1 week, increase Protonix to twice daily and call for a refill

## 2017-09-17 NOTE — Assessment & Plan Note (Signed)
CT chest without contrast in December 2019. Expect resolution of tree-in-bud findings

## 2017-09-17 NOTE — Assessment & Plan Note (Signed)
For reflux, try taking Protonix around lunchtime. This does not work in 1 week, increase Protonix to twice daily and call for a refill  If above does not work then consider [employer]

## 2017-09-29 DIAGNOSIS — M545 Low back pain: Secondary | ICD-10-CM | POA: Diagnosis not present

## 2017-10-12 DIAGNOSIS — M545 Low back pain: Secondary | ICD-10-CM | POA: Diagnosis not present

## 2017-10-13 DIAGNOSIS — Z981 Arthrodesis status: Secondary | ICD-10-CM | POA: Diagnosis not present

## 2017-10-30 ENCOUNTER — Other Ambulatory Visit: Payer: Self-pay

## 2017-11-05 ENCOUNTER — Telehealth: Payer: Self-pay | Admitting: Pulmonary Disease

## 2017-11-05 MED ORDER — PANTOPRAZOLE SODIUM 40 MG PO TBEC: 40.0000 mg | DELAYED_RELEASE_TABLET | Freq: Every day | ORAL | 1 refills | Status: DC

## 2017-11-05 NOTE — Telephone Encounter (Signed)
Pt called back, verified that she only takes 1-40mg  tab daily.  This has been sent to [facility].  Nothing further needed.

## 2017-11-05 NOTE — Telephone Encounter (Signed)
Attempted to call patient, no answer, left message to call back.  Shelia [name] and was told we could sent in Rx electronically when needed.  Per Dr. Bari Mantis OV note patient can increase dose if needed and to ask for refill when right dose is working for her. Will wait to hear from patient before sending in Rx.

## 2017-11-07 ENCOUNTER — Other Ambulatory Visit: Payer: Self-pay | Admitting: Pulmonary Disease

## 2017-11-15 DIAGNOSIS — Z23 Encounter for immunization: Secondary | ICD-10-CM | POA: Diagnosis not present

## 2018-02-16 DIAGNOSIS — R0901 Asphyxia: Secondary | ICD-10-CM | POA: Diagnosis not present

## 2018-02-16 DIAGNOSIS — Z6826 Body mass index (BMI) 26.0-26.9, adult: Secondary | ICD-10-CM | POA: Diagnosis not present

## 2018-02-16 DIAGNOSIS — K589 Irritable bowel syndrome without diarrhea: Secondary | ICD-10-CM | POA: Diagnosis not present

## 2018-02-16 DIAGNOSIS — K219 Gastro-esophageal reflux disease without esophagitis: Secondary | ICD-10-CM | POA: Diagnosis not present

## 2018-02-16 DIAGNOSIS — N183 Chronic kidney disease, stage 3 (moderate): Secondary | ICD-10-CM | POA: Diagnosis not present

## 2018-02-16 DIAGNOSIS — M48061 Spinal stenosis, lumbar region without neurogenic claudication: Secondary | ICD-10-CM | POA: Diagnosis not present

## 2018-02-16 DIAGNOSIS — I10 Essential (primary) hypertension: Secondary | ICD-10-CM | POA: Diagnosis not present

## 2018-02-17 ENCOUNTER — Ambulatory Visit (INDEPENDENT_AMBULATORY_CARE_PROVIDER_SITE_OTHER)
Admission: RE | Admit: 2018-02-17 | Discharge: 2018-02-17 | Disposition: A | Discharge status: Home / Self Care | Payer: Medicare Other | Source: Ambulatory Visit | Attending: Pulmonary Disease | Admitting: Pulmonary Disease

## 2018-02-17 DIAGNOSIS — R918 Other nonspecific abnormal finding of lung field: Secondary | ICD-10-CM | POA: Diagnosis not present

## 2018-02-17 DIAGNOSIS — R9389 Abnormal findings on diagnostic imaging of other specified body structures: Secondary | ICD-10-CM

## 2018-03-16 ENCOUNTER — Ambulatory Visit (INDEPENDENT_AMBULATORY_CARE_PROVIDER_SITE_OTHER): Payer: Medicare Other | Admitting: Pulmonary Disease

## 2018-03-16 ENCOUNTER — Encounter: Payer: Self-pay | Admitting: Pulmonary Disease

## 2018-03-16 DIAGNOSIS — I712 Thoracic aortic aneurysm, without rupture: Secondary | ICD-10-CM | POA: Diagnosis not present

## 2018-03-16 DIAGNOSIS — I7121 Aneurysm of the ascending aorta, without rupture: Secondary | ICD-10-CM

## 2018-03-16 NOTE — Patient Instructions (Addendum)
Scan appears improved. Stay on Protonix, can take this towards evening You do have aortic aneurysm 4 cm-recommend follow-up scan for this in 1 year-CT angiogram with contrast

## 2018-03-16 NOTE — Assessment & Plan Note (Signed)
Tree-in-bud findings right lower lobe have resolved, likely related to reflux and aspiration. She has new patchy faint infiltrate right upper lobe likely related to the same thing. Continue Protonix which she can take towards evening since dinner is her heavy meal.  Discussed risks of long-term Protonix, and advised calcium supplements for bone health

## 2018-03-16 NOTE — Progress Notes (Signed)
   Subjective:    Patient ID: Shelia [name], female    DOB: 05/31/1945, 73 y.o.   MRN: [id number]  HPI  73 yo never smoker  for FU of abnormal CT scan and mild hypoxia noted in PCP office -Showing tree-in-bud findings from 08/2017 which was new compared to 01/2017. She was recovering from a sinus infection and also has severe reflux.  Chief Complaint  Patient presents with  . Follow-up    6 month follow up. States the pantoprazole is working well for her, switched to taking it mid-day.    She has done well in the interim, denies frequent chest colds, breathing is improved.  Her main issue is back pain status post surgery a year ago and some weight gain. We reviewed CT chest in detail today  Ct chest 01/2018 Resolution of previously identified tree-in-bud infiltrates in the RIGHT lung Resolution of previously identified tree-in-bud infiltrates in the RIGHT lung , stable nodules 4 cm asc TAA  Significant tests/ events reviewed  CT angiogram chest 08/2017 which showed no evidence of pulmonary embolism but showed scattered tree-in-bud nodularity on the right lung predominantly in the right lower lobe, mild infection was favored.   CT angiogram from 01/2017 >> bibasilar atelectasis,small hiatal hernia and mildly prominent subcarinal lymph node No evidence of ILD on either scans.  Review of Systems Patient denies significant dyspnea,cough, hemoptysis,  chest pain, palpitations, pedal edema, orthopnea, paroxysmal nocturnal dyspnea, lightheadedness, nausea, vomiting, abdominal or  leg pains      Objective:   Physical Exam  Gen. Pleasant, well-nourished, in no distress ENT - no thrush, no pallor/icterus,no post nasal drip Neck: No JVD, no thyromegaly, no carotid bruits Lungs: no use of accessory muscles, no dullness to percussion, clear without rales or rhonchi  Cardiovascular: Rhythm regular, heart sounds  normal, no murmurs or gallops, no peripheral edema Musculoskeletal: No  deformities, no cyanosis or clubbing        Assessment & Plan:

## 2018-03-16 NOTE — Assessment & Plan Note (Signed)
1 year-CT angiogram with contrast

## 2018-04-16 DIAGNOSIS — Z4889 Encounter for other specified surgical aftercare: Secondary | ICD-10-CM | POA: Diagnosis not present

## 2018-05-01 ENCOUNTER — Other Ambulatory Visit: Payer: Self-pay | Admitting: Pulmonary Disease

## 2018-09-08 DIAGNOSIS — Z1231 Encounter for screening mammogram for malignant neoplasm of breast: Secondary | ICD-10-CM | POA: Diagnosis not present

## 2018-09-08 DIAGNOSIS — M810 Age-related osteoporosis without current pathological fracture: Secondary | ICD-10-CM | POA: Diagnosis not present

## 2018-09-08 DIAGNOSIS — Z90710 Acquired absence of both cervix and uterus: Secondary | ICD-10-CM | POA: Diagnosis not present

## 2018-09-08 DIAGNOSIS — Z981 Arthrodesis status: Secondary | ICD-10-CM | POA: Diagnosis not present

## 2018-09-08 DIAGNOSIS — K589 Irritable bowel syndrome without diarrhea: Secondary | ICD-10-CM | POA: Diagnosis not present

## 2018-09-17 DIAGNOSIS — M859 Disorder of bone density and structure, unspecified: Secondary | ICD-10-CM | POA: Diagnosis not present

## 2018-09-17 DIAGNOSIS — E7849 Other hyperlipidemia: Secondary | ICD-10-CM | POA: Diagnosis not present

## 2018-09-17 DIAGNOSIS — I10 Essential (primary) hypertension: Secondary | ICD-10-CM | POA: Diagnosis not present

## 2018-09-17 DIAGNOSIS — R82998 Other abnormal findings in urine: Secondary | ICD-10-CM | POA: Diagnosis not present

## 2018-09-24 DIAGNOSIS — Z1339 Encounter for screening examination for other mental health and behavioral disorders: Secondary | ICD-10-CM | POA: Diagnosis not present

## 2018-09-24 DIAGNOSIS — J309 Allergic rhinitis, unspecified: Secondary | ICD-10-CM | POA: Diagnosis not present

## 2018-09-24 DIAGNOSIS — N3010 Interstitial cystitis (chronic) without hematuria: Secondary | ICD-10-CM | POA: Diagnosis not present

## 2018-09-24 DIAGNOSIS — I872 Venous insufficiency (chronic) (peripheral): Secondary | ICD-10-CM | POA: Diagnosis not present

## 2018-09-24 DIAGNOSIS — K219 Gastro-esophageal reflux disease without esophagitis: Secondary | ICD-10-CM | POA: Diagnosis not present

## 2018-09-24 DIAGNOSIS — Z0000 Encounter for general adult medical examination without abnormal findings: Secondary | ICD-10-CM | POA: Diagnosis not present

## 2018-09-24 DIAGNOSIS — M810 Age-related osteoporosis without current pathological fracture: Secondary | ICD-10-CM | POA: Diagnosis not present

## 2018-09-24 DIAGNOSIS — R0901 Asphyxia: Secondary | ICD-10-CM | POA: Diagnosis not present

## 2018-09-24 DIAGNOSIS — N183 Chronic kidney disease, stage 3 (moderate): Secondary | ICD-10-CM | POA: Diagnosis not present

## 2018-09-24 DIAGNOSIS — M48061 Spinal stenosis, lumbar region without neurogenic claudication: Secondary | ICD-10-CM | POA: Diagnosis not present

## 2018-09-24 DIAGNOSIS — I129 Hypertensive chronic kidney disease with stage 1 through stage 4 chronic kidney disease, or unspecified chronic kidney disease: Secondary | ICD-10-CM | POA: Diagnosis not present

## 2018-09-24 DIAGNOSIS — K589 Irritable bowel syndrome without diarrhea: Secondary | ICD-10-CM | POA: Diagnosis not present

## 2018-09-24 DIAGNOSIS — E785 Hyperlipidemia, unspecified: Secondary | ICD-10-CM | POA: Diagnosis not present

## 2018-09-24 DIAGNOSIS — Z1331 Encounter for screening for depression: Secondary | ICD-10-CM | POA: Diagnosis not present

## 2018-11-06 DIAGNOSIS — Z23 Encounter for immunization: Secondary | ICD-10-CM | POA: Diagnosis not present

## 2018-12-03 ENCOUNTER — Other Ambulatory Visit: Payer: Self-pay | Admitting: Pulmonary Disease

## 2019-02-01 DIAGNOSIS — E7849 Other hyperlipidemia: Secondary | ICD-10-CM | POA: Diagnosis not present

## 2019-02-22 IMAGING — CT CT L SPINE W/O CM
1 of 6 series · 4 of 14 positions shown, 5 images · non-contrast
Comparison: CT abdomen pelvis 01/11/2014

CLINICAL DATA: Low back pain radiating to the right leg

EXAM:
CT LUMBAR SPINE WITHOUT CONTRAST
TECHNIQUE: Multidetector CT imaging of the lumbar spine was performed without
intravenous contrast administration. Multiplanar CT image
reconstructions were also generated.

[Series 3: l spine soft (person_name) · axial · 0.27mm/px · z∈[-314,-184]mm · 4 of 73 slices shown, 5 images]
[im 15/73  soft-tissue]
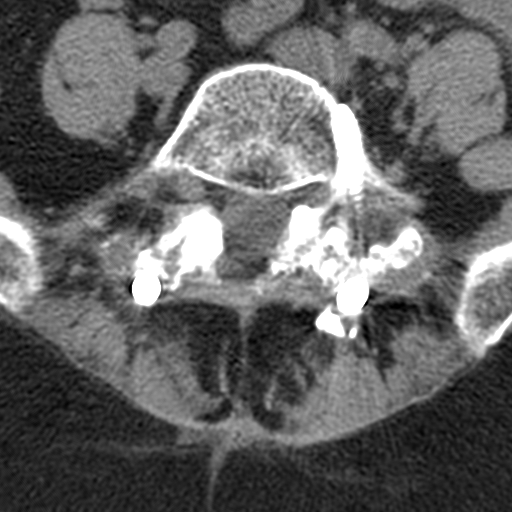
[im 15/73  bone]
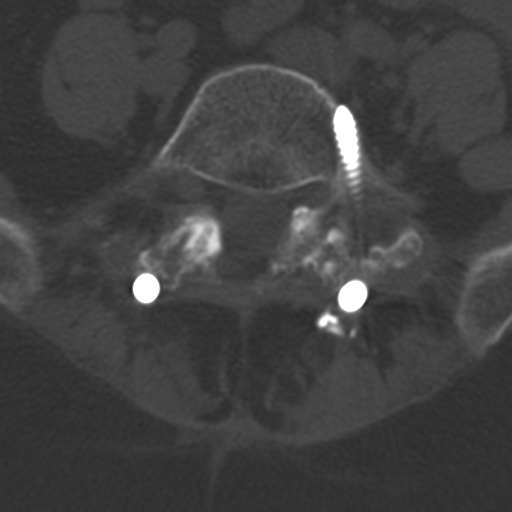
[im 29/73  bone]
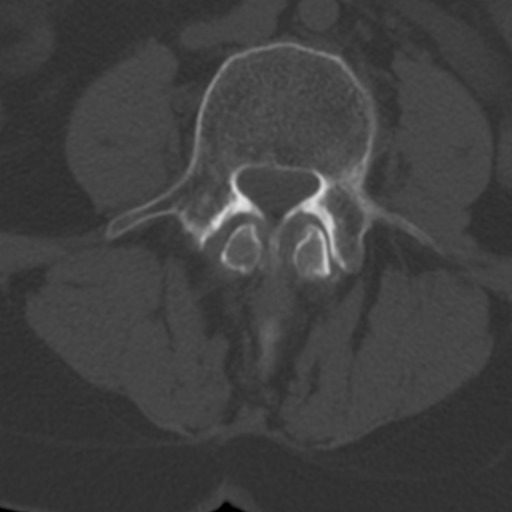
[im 44/73  bone]
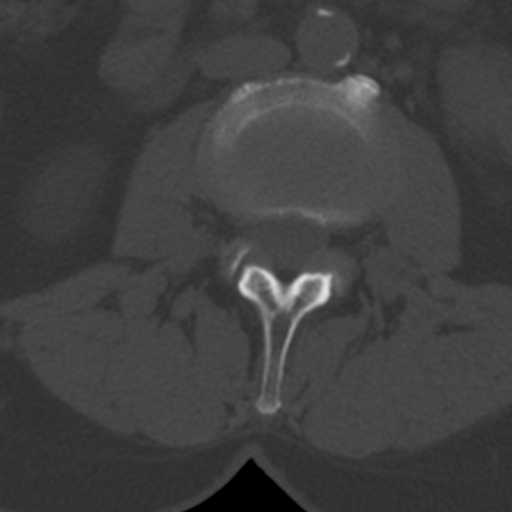
[im 58/73  bone]
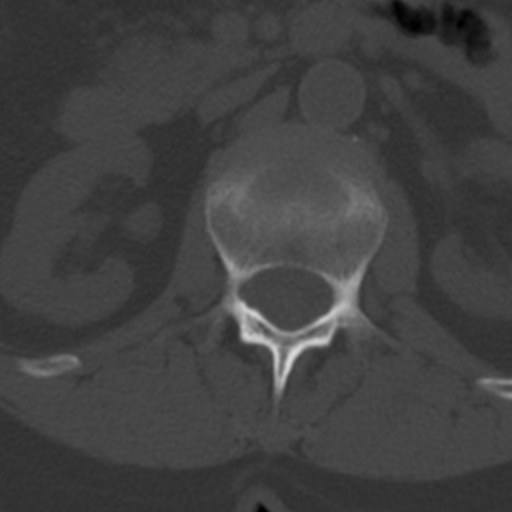

[4 of 14 positions shown; findings below may reference images not displayed]

FINDINGS: Segmentation: Normal

Alignment: There is grade 1 anterolisthesis at L4-L5 that is
worsened compared to the CT of the abdomen and pelvis performed
01/11/2014.

Vertebrae: There is posterior lumbar interbody fusion at L5-S1. No
abnormal lucency or subsidence. No acute fracture.

Paraspinal and other soft tissues: There is aortic atherosclerosis.
The visualized retroperitoneal structures are otherwise
unremarkable.

Disc levels:

T12-L1: Disc space narrowing is mild.  No stenosis.

L1-L2: Partially calcified disc bulge. No spinal canal or neural
foraminal stenosis

L2-L3: Small disc bulge without spinal canal or neural foraminal
stenosis.

L3-L4: Medium-sized disc bulge with ligamentum flavum redundancy and
moderate facet hypertrophy. Severe spinal canal stenosis. Moderate
right and mild left neural foraminal stenosis. Findings are
worsened.

L4-L5: As above, grade 1 anterolisthesis has developed since the
prior examination. There is a medium-sized diffuse disc bulge and
moderate bilateral facet hypertrophy. The spinal canal is severely
narrowed. There is moderate right and severe left neural foraminal
stenosis. These findings worsened compared to the CTA AP 01/11/2014.

L5-S1: There is posterior decompression at this level with wide
patency of the thecal sac. No neural foraminal stenosis.

Visualized sacrum: Normal.
IMPRESSION: 1. Adjacent segment disease with newly developed grade 1 L4-L5
anterolisthesis and severe spinal canal stenosis due to worsening
disc bulge and to worsened facet arthrosis. Moderate right and
severe left neural foraminal stenosis also at this level.
2. Worsening degenerative disease at L3-L4 with medium disc bulge
and moderate facet arthrosis causing severe spinal canal stenosis,
moderate right neural foraminal stenosis and mild left neural
foraminal stenosis.
3. Wide patency of the thecal sac at the surgical level L5-S1.

## 2019-03-02 ENCOUNTER — Other Ambulatory Visit: Payer: Self-pay | Admitting: Pulmonary Disease

## 2019-03-02 DIAGNOSIS — I7121 Aneurysm of the ascending aorta, without rupture: Secondary | ICD-10-CM

## 2019-03-02 DIAGNOSIS — I712 Thoracic aortic aneurysm, without rupture: Secondary | ICD-10-CM

## 2019-03-02 NOTE — Progress Notes (Signed)
Needs to be CTA with AND without to follow AAA Order changed

## 2019-03-14 ENCOUNTER — Ambulatory Visit
Admission: RE | Admit: 2019-03-14 | Discharge: 2019-03-14 | Disposition: A | Discharge status: Home / Self Care | Payer: Medicare Other | Source: Ambulatory Visit | Attending: Pulmonary Disease | Admitting: Pulmonary Disease

## 2019-03-14 DIAGNOSIS — I712 Thoracic aortic aneurysm, without rupture: Secondary | ICD-10-CM

## 2019-03-14 DIAGNOSIS — I7121 Aneurysm of the ascending aorta, without rupture: Secondary | ICD-10-CM

## 2019-03-14 MED ORDER — IOPAMIDOL (ISOVUE-370) INJECTION 76%
75.0000 mL | Freq: Once | INTRAVENOUS | Status: AC | PRN
  Administered 2019-03-14: 13:00:00 75 mL via INTRAVENOUS

## 2019-03-31 ENCOUNTER — Ambulatory Visit (INDEPENDENT_AMBULATORY_CARE_PROVIDER_SITE_OTHER): Payer: Medicare Other | Admitting: Pulmonary Disease

## 2019-03-31 ENCOUNTER — Encounter: Payer: Self-pay | Admitting: Pulmonary Disease

## 2019-03-31 ENCOUNTER — Other Ambulatory Visit: Payer: Self-pay

## 2019-03-31 DIAGNOSIS — I7121 Aneurysm of the ascending aorta, without rupture: Secondary | ICD-10-CM

## 2019-03-31 DIAGNOSIS — I712 Thoracic aortic aneurysm, without rupture: Secondary | ICD-10-CM | POA: Diagnosis not present

## 2019-03-31 DIAGNOSIS — R9389 Abnormal findings on diagnostic imaging of other specified body structures: Secondary | ICD-10-CM | POA: Diagnosis not present

## 2019-03-31 DIAGNOSIS — K219 Gastro-esophageal reflux disease without esophagitis: Secondary | ICD-10-CM

## 2019-03-31 NOTE — Assessment & Plan Note (Signed)
Decrease Protonix to once daily before dinner. Emphasized nonpharmacological measures

## 2019-03-31 NOTE — Progress Notes (Signed)
   Subjective:    Patient ID: Shelia [name], female    DOB: 1945-05-11, 74 y.o.   MRN: [id number]  HPI  74 yo  never smoker for FUof abnormal CT scan and mild hypoxia noted in PCP office -Showing tree-in-bud findings from 08/2017 which was new compared to 01/2017. She was recovering from a sinus infection and also has severe reflux.  Still has occasional breakthrough reflux but episode has not recurred, no fevers. She obtained her first Covid shot. Remains on Protonix 40 mg twice daily  CT chest 03/2019  Stable asc TAA 37 mm, resolved infx & stable nodules compared to 2018 , mod hiatal hernia  Ct chest 01/2018 Resolution of previously identified tree-in-bud infiltrates in the RIGHT lung Resolution of previously identified tree-in-bud infiltrates in the RIGHT lung , stable nodules 4 cm asc TAA  Significant tests/ events reviewed  CT angiogram chest 08/2017 which showed no evidence of pulmonary embolism but showed scattered tree-in-bud nodularity on the right lung predominantly in the right lower lobe, mild infection was favored.  CT angiogram from 01/2017 >>bibasilar atelectasis,small hiatal hernia and mildly prominent subcarinal lymph node No evidence of ILD on either scans  Review of Systems     Objective:   Physical Exam        Assessment & Plan:

## 2019-03-31 NOTE — Patient Instructions (Signed)
Aneurysm is stable Pulmonary nodules are stable since 2018 therefore benign Decrease Protonix to 40 mg before dinner  Call us if symptoms return. Asked Dr. Elsworth Soho if he can follow-up on the aneurysm with CT chest with contrast towards the end of 2022

## 2019-03-31 NOTE — Assessment & Plan Note (Signed)
Stable for 2 years. Can now decrease CT imaging to every 2 years

## 2019-03-31 NOTE — Assessment & Plan Note (Signed)
Pulmonary infiltrates due to reflux aspiration, resolved Nodule stable since 2018, benign

## 2019-05-31 ENCOUNTER — Other Ambulatory Visit: Payer: Self-pay | Admitting: Pulmonary Disease

## 2019-09-09 DIAGNOSIS — Z1231 Encounter for screening mammogram for malignant neoplasm of breast: Secondary | ICD-10-CM | POA: Diagnosis not present

## 2019-09-21 DIAGNOSIS — E7849 Other hyperlipidemia: Secondary | ICD-10-CM | POA: Diagnosis not present

## 2019-09-21 DIAGNOSIS — M810 Age-related osteoporosis without current pathological fracture: Secondary | ICD-10-CM | POA: Diagnosis not present

## 2019-09-28 DIAGNOSIS — N1831 Chronic kidney disease, stage 3a: Secondary | ICD-10-CM | POA: Diagnosis not present

## 2019-09-28 DIAGNOSIS — N3010 Interstitial cystitis (chronic) without hematuria: Secondary | ICD-10-CM | POA: Diagnosis not present

## 2019-09-28 DIAGNOSIS — I10 Essential (primary) hypertension: Secondary | ICD-10-CM | POA: Diagnosis not present

## 2019-09-28 DIAGNOSIS — Z1331 Encounter for screening for depression: Secondary | ICD-10-CM | POA: Diagnosis not present

## 2019-09-28 DIAGNOSIS — Z0000 Encounter for general adult medical examination without abnormal findings: Secondary | ICD-10-CM | POA: Diagnosis not present

## 2019-09-28 DIAGNOSIS — M1990 Unspecified osteoarthritis, unspecified site: Secondary | ICD-10-CM | POA: Diagnosis not present

## 2019-09-28 DIAGNOSIS — R82998 Other abnormal findings in urine: Secondary | ICD-10-CM | POA: Diagnosis not present

## 2019-09-28 DIAGNOSIS — R0901 Asphyxia: Secondary | ICD-10-CM | POA: Diagnosis not present

## 2019-09-28 DIAGNOSIS — I129 Hypertensive chronic kidney disease with stage 1 through stage 4 chronic kidney disease, or unspecified chronic kidney disease: Secondary | ICD-10-CM | POA: Diagnosis not present

## 2019-09-28 DIAGNOSIS — I712 Thoracic aortic aneurysm, without rupture: Secondary | ICD-10-CM | POA: Diagnosis not present

## 2019-09-28 DIAGNOSIS — M810 Age-related osteoporosis without current pathological fracture: Secondary | ICD-10-CM | POA: Diagnosis not present

## 2019-09-28 DIAGNOSIS — K219 Gastro-esophageal reflux disease without esophagitis: Secondary | ICD-10-CM | POA: Diagnosis not present

## 2019-09-28 DIAGNOSIS — M48061 Spinal stenosis, lumbar region without neurogenic claudication: Secondary | ICD-10-CM | POA: Diagnosis not present

## 2019-09-28 DIAGNOSIS — E785 Hyperlipidemia, unspecified: Secondary | ICD-10-CM | POA: Diagnosis not present

## 2019-10-06 DIAGNOSIS — Z1212 Encounter for screening for malignant neoplasm of rectum: Secondary | ICD-10-CM | POA: Diagnosis not present

## 2019-10-15 DIAGNOSIS — Z23 Encounter for immunization: Secondary | ICD-10-CM | POA: Diagnosis not present

## 2019-10-26 ENCOUNTER — Encounter: Payer: Self-pay | Admitting: Podiatry

## 2019-10-26 ENCOUNTER — Other Ambulatory Visit: Payer: Self-pay

## 2019-10-26 ENCOUNTER — Ambulatory Visit (INDEPENDENT_AMBULATORY_CARE_PROVIDER_SITE_OTHER): Payer: Medicare Other | Admitting: Podiatry

## 2019-10-26 DIAGNOSIS — I739 Peripheral vascular disease, unspecified: Secondary | ICD-10-CM

## 2019-10-26 DIAGNOSIS — I999 Unspecified disorder of circulatory system: Secondary | ICD-10-CM

## 2019-10-26 DIAGNOSIS — L600 Ingrowing nail: Secondary | ICD-10-CM

## 2019-10-27 ENCOUNTER — Telehealth: Payer: Self-pay

## 2019-10-27 NOTE — Telephone Encounter (Signed)
Pt was in the office on Wednesday 10/26/19 and received an ingrown toenail procedure of her left great toe. Pt is still having pain and it has been throbbing a lot. Pt would like something for pain if possible. Wasn't for sure after her visit if something would be called in. Please advise.

## 2019-10-28 ENCOUNTER — Other Ambulatory Visit: Payer: Self-pay

## 2019-10-28 ENCOUNTER — Ambulatory Visit (HOSPITAL_COMMUNITY)
Admission: RE | Admit: 2019-10-28 | Discharge: 2019-10-28 | Disposition: A | Discharge status: Home / Self Care | Payer: Medicare Other | Source: Ambulatory Visit | Attending: Podiatry | Admitting: Podiatry

## 2019-10-28 ENCOUNTER — Encounter: Payer: Self-pay | Admitting: Podiatry

## 2019-10-28 DIAGNOSIS — I739 Peripheral vascular disease, unspecified: Secondary | ICD-10-CM | POA: Diagnosis not present

## 2019-10-28 MED ORDER — ACETAMINOPHEN-CODEINE #3 300-30 MG PO TABS: 1.0000 | ORAL_TABLET | ORAL | 0 refills | Status: DC | PRN

## 2019-10-28 NOTE — Progress Notes (Signed)
Subjective:  Patient ID: Shelia [name], female    DOB: 1945/03/16,  MRN: [id number]  Chief Complaint  Patient presents with  . Nail Problem    left big toenail    74 y.o. female presents with the above complaint. Patient presents with the ingrown to the left medial border. Patient states been hurting a while. There is some drainage associated with it as well. Patient has been soaking it in Betadine at [facility]. Patient is not a diabetic. She had it self debridement but has been causing her more pain. She would like to have it removed. However she has poor circulation to the left lower extremity.   Review of Systems: Negative except as noted in the HPI. Denies N/V/F/Ch.  Past Medical History:  Diagnosis Date  . Anxiety   . Cystitis   . Depression   . DJD (degenerative joint disease)   . GERD (gastroesophageal reflux disease)   . Hyperlipidemia   . Hypertension   . Irritable bowel syndrome (IBS)   . PONV (postoperative nausea and vomiting)    with gallbladder surgery 2014  . Spinal stenosis     Current Outpatient Medications:  .  acetaminophen (TYLENOL) 650 MG suppository, Place 650 mg rectally 2 (two) times daily as needed., Disp: , Rfl:  .  acetaminophen-codeine (TYLENOL #3) 300-30 MG tablet, Take 1-2 tablets by mouth every 4 (four) hours as needed for moderate pain., Disp: 30 tablet, Rfl: 0 .  aspirin EC 81 MG tablet, Take 81 mg by mouth daily., Disp: , Rfl:  .  Calcium-Magnesium-Vitamin D (CALCIUM 1200+D3 PO), Take by mouth., Disp: , Rfl:  .  cetirizine (ZYRTEC) 10 MG tablet, Take 10 mg by mouth daily., Disp: , Rfl:  .  cholecalciferol (VITAMIN D) 1000 UNITS tablet, Take 1,000 Units by mouth daily., Disp: , Rfl:  .  fish oil-omega-3 fatty acids 1000 MG capsule, Take 1 g by mouth daily. , Disp: , Rfl:  .  fluticasone (FLONASE) 50 MCG/ACT nasal spray, Place 2 sprays into both nostrils daily as needed for allergies or rhinitis., Disp: , Rfl:  .  hydrochlorothiazide  (HYDRODIURIL) 25 MG tablet, Take 12.5-25 mg by mouth daily. Dose varies based on fluid retention/blood pressure., Disp: , Rfl:  .  losartan (COZAAR) 100 MG tablet, Take 100 mg by mouth daily., Disp: , Rfl:  .  pantoprazole (PROTONIX) 40 MG tablet, TAKE 1 TABLET EVERY DAY, Disp: 90 tablet, Rfl: 1 .  rosuvastatin (CRESTOR) 5 MG tablet, , Disp: , Rfl:  .  telmisartan (MICARDIS) 80 MG tablet, , Disp: , Rfl:  .  zolpidem (AMBIEN) 10 MG tablet, Take 10 mg by mouth at bedtime as needed for sleep., Disp: , Rfl:   Social History   Tobacco Use  Smoking Status Never Smoker  Smokeless Tobacco Never Used    No Known Allergies Objective:  There were no vitals filed for this visit. There is no height or weight on file to calculate BMI. Constitutional Well developed. Well nourished.  Vascular Dorsalis pedis pulses non-palpable bilaterally. Posterior tibial pulses non- palpable bilaterally. Capillary refill normal to all digits.  No cyanosis or clubbing noted. Pedal hair growth normal.  Neurologic Normal speech. Oriented to person, place, and time. Epicritic sensation to light touch grossly present bilaterally.  Dermatologic Painful ingrowing nail at lateral nail borders of the hallux nail left. No other open wounds. No skin lesions.  Orthopedic: Normal joint ROM without pain or crepitus bilaterally. No visible deformities. No bony tenderness.   Radiographs:  None Assessment:   1. Peripheral vascular disease (HCC)   2. Vascular abnormality   3. Ingrown left big toenail    Plan:  Patient was evaluated and treated and all questions answered.  Ingrown Nail, left with concern for underlying peripheral vascular disease/vascular abnormality -I explained to the patient the etiology of ingrown and various treatment options were discussed. Given that clinically I am not able to palpate her pulses I would like to make sure that her circulation to the left lower extremity is within normal limits. I  will order ABI PVR study prior to removing the ingrown toenail. If the circulation is within more normal limit including toe pressures I will proceed with the ingrown toenail removal. I discussed this with the patient in extensive detail. Patient states understanding. No follow-ups on file.

## 2019-10-28 NOTE — Telephone Encounter (Signed)
done

## 2019-10-29 DIAGNOSIS — H2513 Age-related nuclear cataract, bilateral: Secondary | ICD-10-CM | POA: Diagnosis not present

## 2019-11-02 ENCOUNTER — Telehealth (INDEPENDENT_AMBULATORY_CARE_PROVIDER_SITE_OTHER): Payer: Medicare Other | Admitting: Podiatry

## 2019-11-02 NOTE — Telephone Encounter (Signed)
Patient called and would like to discuss results of circulation testing and plan moving forward for her ingrown toenail. Patient call back number is [phone].

## 2019-11-04 ENCOUNTER — Ambulatory Visit (INDEPENDENT_AMBULATORY_CARE_PROVIDER_SITE_OTHER): Payer: Medicare Other | Admitting: Podiatry

## 2019-11-04 ENCOUNTER — Other Ambulatory Visit: Payer: Self-pay

## 2019-11-04 DIAGNOSIS — L600 Ingrowing nail: Secondary | ICD-10-CM

## 2019-11-04 DIAGNOSIS — I999 Unspecified disorder of circulatory system: Secondary | ICD-10-CM

## 2019-11-08 ENCOUNTER — Encounter: Payer: Self-pay | Admitting: Podiatry

## 2019-11-08 NOTE — Progress Notes (Signed)
Subjective:  Patient ID: Shelia [name], female    DOB: Sep 25, 1945,  MRN: [id number]  Chief Complaint  Patient presents with  . Ingrown Toenail    74 y.o. female presents with the above complaint.  Patient presents with left medial border ingrown.  Patient had completed her vascular study which showed good flow to the lower extremity.  At this point I would like to proceed with an ingrown procedure.  Patient agrees with the plan.  She does not have diabetes she denies being on blood thinners   Review of Systems: Negative except as noted in the HPI. Denies N/V/F/Ch.  Past Medical History:  Diagnosis Date  . Anxiety   . Cystitis   . Depression   . DJD (degenerative joint disease)   . GERD (gastroesophageal reflux disease)   . Hyperlipidemia   . Hypertension   . Irritable bowel syndrome (IBS)   . PONV (postoperative nausea and vomiting)    with gallbladder surgery 2014  . Spinal stenosis     Current Outpatient Medications:  .  acetaminophen (TYLENOL) 650 MG suppository, Place 650 mg rectally 2 (two) times daily as needed., Disp: , Rfl:  .  acetaminophen-codeine (TYLENOL #3) 300-30 MG tablet, Take 1-2 tablets by mouth every 4 (four) hours as needed for moderate pain., Disp: 30 tablet, Rfl: 0 .  aspirin EC 81 MG tablet, Take 81 mg by mouth daily., Disp: , Rfl:  .  Calcium-Magnesium-Vitamin D (CALCIUM 1200+D3 PO), Take by mouth., Disp: , Rfl:  .  cetirizine (ZYRTEC) 10 MG tablet, Take 10 mg by mouth daily., Disp: , Rfl:  .  cholecalciferol (VITAMIN D) 1000 UNITS tablet, Take 1,000 Units by mouth daily., Disp: , Rfl:  .  fish oil-omega-3 fatty acids 1000 MG capsule, Take 1 g by mouth daily. , Disp: , Rfl:  .  fluticasone (FLONASE) 50 MCG/ACT nasal spray, Place 2 sprays into both nostrils daily as needed for allergies or rhinitis., Disp: , Rfl:  .  hydrochlorothiazide (HYDRODIURIL) 25 MG tablet, Take 12.5-25 mg by mouth daily. Dose varies based on fluid retention/blood pressure.,  Disp: , Rfl:  .  losartan (COZAAR) 100 MG tablet, Take 100 mg by mouth daily., Disp: , Rfl:  .  pantoprazole (PROTONIX) 40 MG tablet, TAKE 1 TABLET EVERY DAY, Disp: 90 tablet, Rfl: 1 .  rosuvastatin (CRESTOR) 5 MG tablet, , Disp: , Rfl:  .  telmisartan (MICARDIS) 80 MG tablet, , Disp: , Rfl:  .  zolpidem (AMBIEN) 10 MG tablet, Take 10 mg by mouth at bedtime as needed for sleep., Disp: , Rfl:   Social History   Tobacco Use  Smoking Status Never Smoker  Smokeless Tobacco Never Used    No Known Allergies Objective:  There were no vitals filed for this visit. There is no height or weight on file to calculate BMI. Constitutional Well developed. Well nourished.  Vascular Dorsalis pedis pulses palpable bilaterally. Posterior tibial pulses palpable bilaterally. Capillary refill normal to all digits.  No cyanosis or clubbing noted. Pedal hair growth normal.  Neurologic Normal speech. Oriented to person, place, and time. Epicritic sensation to light touch grossly present bilaterally.  Dermatologic Painful ingrowing nail at Medial nail borders of the hallux nail left. No other open wounds. No skin lesions.  Orthopedic: Normal joint ROM without pain or crepitus bilaterally. No visible deformities. No bony tenderness.   Radiographs: None Assessment:   1. Vascular abnormality   2. Ingrown left big toenail    Plan:  Patient was  evaluated and treated and all questions answered.  Ingrown Nail, left -Patient elects to proceed with minor surgery to remove ingrown toenail removal today. Consent reviewed and signed by patient. -Ingrown nail excised. See procedure note. -Educated on post-procedure care including soaking. Written instructions provided and reviewed. -Patient to follow up in 2 weeks for nail check. -I reviewed ABIs PVRs with patient in extensive detail which showed good lower extremity circulation to both feet.  Procedure: Excision of Ingrown Toenail Location: Left 1st toe  medial nail borders. Anesthesia: Lidocaine 1% plain; 1.5 mL and Marcaine 0.5% plain; 1.5 mL, digital block. Skin Prep: Betadine. Dressing: Silvadene; telfa; dry, sterile, compression dressing. Technique: Following skin prep, the toe was exsanguinated and a tourniquet was secured at the base of the toe. The affected nail border was freed, split with a nail splitter, and excised. Chemical matrixectomy was then performed with phenol and irrigated out with alcohol. The tourniquet was then removed and sterile dressing applied. Disposition: Patient tolerated procedure well. Patient to return in 2 weeks for follow-up.   No follow-ups on file.

## 2019-11-18 ENCOUNTER — Ambulatory Visit: Payer: Medicare Other | Admitting: Podiatry

## 2019-11-29 DIAGNOSIS — Z23 Encounter for immunization: Secondary | ICD-10-CM | POA: Diagnosis not present

## 2020-01-31 ENCOUNTER — Other Ambulatory Visit: Payer: Self-pay | Admitting: Pulmonary Disease

## 2020-04-13 ENCOUNTER — Other Ambulatory Visit: Payer: Self-pay | Admitting: Pulmonary Disease

## 2020-04-21 ENCOUNTER — Other Ambulatory Visit: Payer: Self-pay | Admitting: Pulmonary Disease

## 2020-04-23 DIAGNOSIS — K219 Gastro-esophageal reflux disease without esophagitis: Secondary | ICD-10-CM | POA: Diagnosis not present

## 2020-04-23 DIAGNOSIS — M810 Age-related osteoporosis without current pathological fracture: Secondary | ICD-10-CM | POA: Diagnosis not present

## 2020-04-23 DIAGNOSIS — N3010 Interstitial cystitis (chronic) without hematuria: Secondary | ICD-10-CM | POA: Diagnosis not present

## 2020-04-23 DIAGNOSIS — R0901 Asphyxia: Secondary | ICD-10-CM | POA: Diagnosis not present

## 2020-04-23 DIAGNOSIS — E785 Hyperlipidemia, unspecified: Secondary | ICD-10-CM | POA: Diagnosis not present

## 2020-04-23 DIAGNOSIS — M48061 Spinal stenosis, lumbar region without neurogenic claudication: Secondary | ICD-10-CM | POA: Diagnosis not present

## 2020-04-23 DIAGNOSIS — I10 Essential (primary) hypertension: Secondary | ICD-10-CM | POA: Diagnosis not present

## 2020-04-23 DIAGNOSIS — I493 Ventricular premature depolarization: Secondary | ICD-10-CM | POA: Diagnosis not present

## 2020-04-23 DIAGNOSIS — I712 Thoracic aortic aneurysm, without rupture: Secondary | ICD-10-CM | POA: Diagnosis not present

## 2020-04-23 DIAGNOSIS — M1990 Unspecified osteoarthritis, unspecified site: Secondary | ICD-10-CM | POA: Diagnosis not present

## 2020-04-23 DIAGNOSIS — N1832 Chronic kidney disease, stage 3b: Secondary | ICD-10-CM | POA: Diagnosis not present

## 2020-05-02 ENCOUNTER — Other Ambulatory Visit: Payer: Self-pay | Admitting: Pulmonary Disease

## 2020-06-01 DIAGNOSIS — Z23 Encounter for immunization: Secondary | ICD-10-CM | POA: Diagnosis not present

## 2020-07-05 DIAGNOSIS — L814 Other melanin hyperpigmentation: Secondary | ICD-10-CM | POA: Diagnosis not present

## 2020-07-05 DIAGNOSIS — L820 Inflamed seborrheic keratosis: Secondary | ICD-10-CM | POA: Diagnosis not present

## 2020-09-14 DIAGNOSIS — Z1231 Encounter for screening mammogram for malignant neoplasm of breast: Secondary | ICD-10-CM | POA: Diagnosis not present

## 2020-10-19 ENCOUNTER — Other Ambulatory Visit: Payer: Self-pay | Admitting: Pulmonary Disease

## 2020-11-04 DIAGNOSIS — Z23 Encounter for immunization: Secondary | ICD-10-CM | POA: Diagnosis not present

## 2020-11-07 DIAGNOSIS — Z23 Encounter for immunization: Secondary | ICD-10-CM | POA: Diagnosis not present

## 2020-11-29 ENCOUNTER — Telehealth: Payer: Self-pay

## 2020-11-29 ENCOUNTER — Other Ambulatory Visit: Payer: Self-pay | Admitting: Pulmonary Disease

## 2020-11-29 NOTE — Telephone Encounter (Signed)
ATC patient to schedule an OV so we can refill her protonix. LMTCB at [place] office.

## 2020-12-07 DIAGNOSIS — E785 Hyperlipidemia, unspecified: Secondary | ICD-10-CM | POA: Diagnosis not present

## 2020-12-07 DIAGNOSIS — M810 Age-related osteoporosis without current pathological fracture: Secondary | ICD-10-CM | POA: Diagnosis not present

## 2020-12-10 DIAGNOSIS — M810 Age-related osteoporosis without current pathological fracture: Secondary | ICD-10-CM | POA: Diagnosis not present

## 2020-12-10 DIAGNOSIS — R82998 Other abnormal findings in urine: Secondary | ICD-10-CM | POA: Diagnosis not present

## 2020-12-10 DIAGNOSIS — E785 Hyperlipidemia, unspecified: Secondary | ICD-10-CM | POA: Diagnosis not present

## 2020-12-10 DIAGNOSIS — I493 Ventricular premature depolarization: Secondary | ICD-10-CM | POA: Diagnosis not present

## 2020-12-10 DIAGNOSIS — M1990 Unspecified osteoarthritis, unspecified site: Secondary | ICD-10-CM | POA: Diagnosis not present

## 2020-12-10 DIAGNOSIS — Z0000 Encounter for general adult medical examination without abnormal findings: Secondary | ICD-10-CM | POA: Diagnosis not present

## 2020-12-10 DIAGNOSIS — N1832 Chronic kidney disease, stage 3b: Secondary | ICD-10-CM | POA: Diagnosis not present

## 2020-12-10 DIAGNOSIS — K219 Gastro-esophageal reflux disease without esophagitis: Secondary | ICD-10-CM | POA: Diagnosis not present

## 2020-12-10 DIAGNOSIS — I10 Essential (primary) hypertension: Secondary | ICD-10-CM | POA: Diagnosis not present

## 2020-12-10 DIAGNOSIS — Z1212 Encounter for screening for malignant neoplasm of rectum: Secondary | ICD-10-CM | POA: Diagnosis not present

## 2020-12-10 DIAGNOSIS — N3010 Interstitial cystitis (chronic) without hematuria: Secondary | ICD-10-CM | POA: Diagnosis not present

## 2020-12-10 DIAGNOSIS — M48061 Spinal stenosis, lumbar region without neurogenic claudication: Secondary | ICD-10-CM | POA: Diagnosis not present

## 2020-12-10 DIAGNOSIS — R0901 Asphyxia: Secondary | ICD-10-CM | POA: Diagnosis not present

## 2020-12-14 ENCOUNTER — Encounter: Payer: Self-pay | Admitting: Primary Care

## 2020-12-14 ENCOUNTER — Other Ambulatory Visit: Payer: Self-pay

## 2020-12-14 ENCOUNTER — Ambulatory Visit (INDEPENDENT_AMBULATORY_CARE_PROVIDER_SITE_OTHER): Payer: Medicare Other | Admitting: Primary Care

## 2020-12-14 VITALS — BP 120/74 | HR 87 | Temp 97.9°F | Ht 67.0 in | Wt 170.4 lb

## 2020-12-14 DIAGNOSIS — R918 Other nonspecific abnormal finding of lung field: Secondary | ICD-10-CM

## 2020-12-14 DIAGNOSIS — I7121 Aneurysm of the ascending aorta, without rupture: Secondary | ICD-10-CM

## 2020-12-14 MED ORDER — PANTOPRAZOLE SODIUM 40 MG PO TBEC: 40.0000 mg | DELAYED_RELEASE_TABLET | Freq: Every day | ORAL | 3 refills | Status: DC

## 2020-12-14 NOTE — Progress Notes (Signed)
@Patient  ID: Shelia [name], female    DOB: [birth year], 75 y.o.   MRN: [id number]  Chief Complaint  Patient presents with   Follow-up    Pt. Needs medication refill.    Referring provider: Prince Solian, MD  HPI:  75 year old female, never smoked.  Past medical history significant for pulmonary infiltrates, GERD.  Dr. Elsworth Soho, last seen in office on 03/31/2019.   12/14/2020- Interim hx  Patient presents today for 1 year follow-up pulmonary infiltrates and GERD. Infiltrates due to reflux aspiration, nodule stable since 2018 and considered benign. She is maintained on Protonix daily for GERD. She is doing well today. Occasionally when traveling she may not take Protonix medication and can notice reflux is worse. She has been trying her best to follow GERD diet. She does tend to eat late around 7:30-8pm. She experiences shortness of breath with activity. Needs to stop and rest, recovers within 30 mins. Back pain limits her.   No Known Allergies  Immunization History  Administered Date(s) Administered   DTaP 10/31/2002   Fluad Quad(high Dose 65+) 11/06/2018   Influenza, High Dose Seasonal PF 12/06/2016, 11/15/2017   PFIZER(Purple Top)SARS-COV-2 Vaccination 03/05/2019   Tdap 08/17/2012    Past Medical History:  Diagnosis Date   Anxiety    Cystitis    Depression    DJD (degenerative joint disease)    GERD (gastroesophageal reflux disease)    Hyperlipidemia    Hypertension    Irritable bowel syndrome (IBS)    PONV (postoperative nausea and vomiting)    with gallbladder surgery 2014   Spinal stenosis     Tobacco History: Social History   Tobacco Use  Smoking Status Never  Smokeless Tobacco Never   Counseling given: Not Answered   Outpatient Medications Prior to Visit  Medication Sig Dispense Refill   acetaminophen (TYLENOL) 650 MG suppository Place 650 mg rectally 2 (two) times daily as needed.     acetaminophen-codeine (TYLENOL #3) 300-30 MG tablet Take 1-2  tablets by mouth every 4 (four) hours as needed for moderate pain. 30 tablet 0   aspirin EC 81 MG tablet Take 81 mg by mouth daily.     Calcium-Magnesium-Vitamin D (CALCIUM 1200+D3 PO) Take by mouth.     cetirizine (ZYRTEC) 10 MG tablet Take 10 mg by mouth daily.     cholecalciferol (VITAMIN D) 1000 UNITS tablet Take 1,000 Units by mouth daily.     fish oil-omega-3 fatty acids 1000 MG capsule Take 1 g by mouth daily.      fluticasone (FLONASE) 50 MCG/ACT nasal spray Place 2 sprays into both nostrils daily as needed for allergies or rhinitis.     hydrochlorothiazide (HYDRODIURIL) 25 MG tablet Take 12.5-25 mg by mouth daily. Dose varies based on fluid retention/blood pressure.     rosuvastatin (CRESTOR) 5 MG tablet      telmisartan (MICARDIS) 80 MG tablet      zolpidem (AMBIEN) 10 MG tablet Take 10 mg by mouth at bedtime as needed for sleep.     pantoprazole (PROTONIX) 40 MG tablet TAKE 1 TABLET EVERY DAY 60 tablet 3   losartan (COZAAR) 100 MG tablet Take 100 mg by mouth daily.     No facility-administered medications prior to visit.    Review of Systems  Review of Systems  Constitutional: Negative.   HENT: Negative.    Respiratory:  Negative for cough, chest tightness and wheezing.        DOE  Cardiovascular: Negative.   Musculoskeletal:  Chronic back pain    Physical Exam  BP 120/74 (BP Location: Right Arm, Patient Position: Sitting, Cuff Size: Normal)   Pulse 87   Temp 97.9 F (36.6 C) (Oral)   Ht 5\' 7"  (1.702 m)   Wt 170 lb 6.4 oz (77.3 kg)   LMP 08/02/1979   SpO2 91%   BMI 26.69 kg/m  Physical Exam Constitutional:      Appearance: Normal appearance.  HENT:     Head: Normocephalic and atraumatic.  Cardiovascular:     Rate and Rhythm: Normal rate and regular rhythm.  Pulmonary:     Effort: Pulmonary effort is normal.     Breath sounds: Normal breath sounds. No wheezing, rhonchi or rales.  Musculoskeletal:        General: Normal range of motion.  Skin:     General: Skin is warm and dry.  Neurological:     General: No focal deficit present.     Mental Status: She is alert and oriented to person, place, and time. Mental status is at baseline.     Lab Results:  CBC    Component Value Date/Time   WBC 11.3 (H) 04/24/2017 1521   RBC 3.56 (L) 04/24/2017 1521   HGB 10.2 (L) 04/24/2017 1521   HGB 13.3 08/17/2012 1530   HCT 31.7 (L) 04/24/2017 1521   PLT 221 04/24/2017 1521   MCV 89.0 04/24/2017 1521   MCH 28.7 04/24/2017 1521   MCHC 32.2 04/24/2017 1521   RDW 13.0 04/24/2017 1521   LYMPHSABS 1.5 08/02/2010 0940   MONOABS 0.4 08/02/2010 0940   EOSABS 0.0 08/02/2010 0940   BASOSABS 0.0 08/02/2010 0940    BMET    Component Value Date/Time   NA 140 04/16/2017 0817   K 3.3 (L) 04/16/2017 0817   CL 101 04/16/2017 0817   CO2 26 04/16/2017 0817   GLUCOSE 101 (H) 04/16/2017 0817   BUN 13 04/16/2017 0817   CREATININE 1.21 (H) 04/16/2017 0817   CALCIUM 9.8 04/16/2017 0817   GFRNONAA 44 (L) 04/16/2017 0817   GFRAA 51 (L) 04/16/2017 0817    BNP No results found for: BNP  ProBNP No results found for: PROBNP  Imaging: No results found.   Assessment & Plan:   Pulmonary infiltrates - Pulmonary infiltrates felt to be d/t GERD. She is maintained on Protonix 40mg  daily, refills provided today. Reinforced importance of following GERD diet  Ascending aortic aneurysm (HCC) - Stable; CT imaging every 2 years for monitoring ascending aortic aneurysm. Next due in January 2023   Martyn Ehrich, NP 12/18/2020

## 2020-12-14 NOTE — Patient Instructions (Addendum)
Recommendations: Follow GERD diet  Do not lay down flat for 2 hours after eating dinner  Continue Protonix 40 mg daily Call our office if shortness of breath worsens at all   Orders: CTA in January 2023 (ordered)   Follow-up: 1 year with Dr. Elsworth Soho or sooner if needed     Food Choices for Gastroesophageal Reflux Disease, Adult When you have gastroesophageal reflux disease (GERD), the foods you eat and your eating habits are very important. Choosing the right foods can help ease your discomfort. Think about working with a food expert (dietitian) to help you make good choices. What are tips for following this plan? Reading food labels Look for foods that are low in saturated fat. Foods that may help with your symptoms include: Foods that have less than 5% of daily value (DV) of fat. Foods that have 0 grams of trans fat. Cooking Do not fry your food. Cook your food by baking, steaming, grilling, or broiling. These are all methods that do not need a lot of fat for cooking. To add flavor, try to use herbs that are low in spice and acidity. Meal planning  Choose healthy foods that are low in fat, such as: Fruits and vegetables. Whole grains. Low-fat dairy products. Lean meats, fish, and poultry. Eat small meals often instead of eating 3 large meals each day. Eat your meals slowly in a place where you are relaxed. Avoid bending over or lying down until 2-3 hours after eating. Limit high-fat foods such as fatty meats or fried foods. Limit your intake of fatty foods, such as oils, butter, and shortening. Avoid the following as told by your doctor: Foods that cause symptoms. These may be different for different people. Keep a food diary to keep track of foods that cause symptoms. Alcohol. Drinking a lot of liquid with meals. Eating meals during the 2-3 hours before bed. Lifestyle Stay at a healthy weight. Ask your doctor what weight is healthy for you. If you need to lose weight, work  with your doctor to do so safely. Exercise for at least 30 minutes on 5 or more days each week, or as told by your doctor. Wear loose-fitting clothes. Do not smoke or use any products that contain nicotine or tobacco. If you need help quitting, ask your doctor. Sleep with the head of your bed higher than your feet. Use a wedge under the mattress or blocks under the bed frame to raise the head of the bed. Chew sugar-free gum after meals. What foods should eat? Eat a healthy, well-balanced diet of fruits, vegetables, whole grains, low-fat dairy products, lean meats, fish, and poultry. Each person is different. Foods that may cause symptoms in one person may not cause any symptoms in another person. Work with your doctor to find foods that are safe for you. The items listed above may not be a complete list of what you can eat and drink. Contact a food expert for more options. What foods should I avoid? Limiting some of these foods may help in managing the symptoms of GERD. Everyone is different. Talk with a food expert or your doctor to help you find the exact foods to avoid, if any. Fruits Any fruits prepared with added fat. Any fruits that cause symptoms. For some people, this may include citrus fruits, such as oranges, grapefruit, pineapple, and lemons. Vegetables Deep-fried vegetables. [place] fries. Any vegetables prepared with added fat. Any vegetables that cause symptoms. For some people, this may include tomatoes and tomato  products, chili peppers, onions and garlic, and horseradish. Grains Pastries or quick breads with added fat. Meats and other proteins High-fat meats, such as fatty beef or pork, hot dogs, ribs, ham, sausage, salami, and bacon. Fried meat or protein, including fried fish and fried chicken. Nuts and nut butters, in large amounts. Dairy Whole milk and chocolate milk. Sour cream. Cream. Ice cream. Cream cheese. Milkshakes. Fats and oils Butter. Margarine. Shortening.  Ghee. Beverages Coffee and tea, with or without caffeine. Carbonated beverages. Sodas. Energy drinks. Fruit juice made with acidic fruits, such as orange or grapefruit. Tomato juice. Alcoholic drinks. Sweets and desserts Chocolate and cocoa. Donuts. Seasonings and condiments Pepper. Peppermint and spearmint. Added salt. Any condiments, herbs, or seasonings that cause symptoms. For some people, this may include curry, hot sauce, or vinegar-based salad dressings. The items listed above may not be a complete list of what you should not eat and drink. Contact a food expert for more options. Questions to ask your doctor Diet and lifestyle changes are often the first steps that are taken to manage symptoms of GERD. If diet and lifestyle changes do not help, talk with your doctor about taking medicines. Where to find more information [employer] G[employer]intestinal [employer]: aboutgerd.org Summary When you have GERD, food and lifestyle choices are very important in easing your symptoms. Eat small meals often instead of 3 large meals a day. Eat your meals slowly and in a place where you are relaxed. Avoid bending over or lying down until 2-3 hours after eating. Limit high-fat foods such as fatty meats or fried foods. This information is not intended to replace advice given to you by your health care provider. Make sure you discuss any questions you have with your health care provider. Document Revised: 08/29/2019 Document Reviewed: 08/29/2019 Elsevier Patient Education  [place].

## 2020-12-18 ENCOUNTER — Encounter: Payer: Self-pay | Admitting: Primary Care

## 2020-12-18 DIAGNOSIS — R918 Other nonspecific abnormal finding of lung field: Secondary | ICD-10-CM | POA: Insufficient documentation

## 2020-12-18 NOTE — Assessment & Plan Note (Addendum)
-   Stable; CT imaging every 2 years for monitoring ascending aortic aneurysm. Next due in January 2023

## 2020-12-18 NOTE — Assessment & Plan Note (Addendum)
-   Pulmonary infiltrates felt to be d/t GERD. She is maintained on Protonix 40mg  daily, refills provided today. Reinforced importance of following GERD diet

## 2021-03-13 ENCOUNTER — Inpatient Hospital Stay: Admission: RE | Admit: 2021-03-13 | Payer: Medicare Other | Source: Ambulatory Visit

## 2021-03-29 ENCOUNTER — Telehealth: Payer: Self-pay | Admitting: Primary Care

## 2021-03-29 ENCOUNTER — Ambulatory Visit
Admission: RE | Admit: 2021-03-29 | Discharge: 2021-03-29 | Disposition: A | Discharge status: Home / Self Care | Payer: Medicare Other | Source: Ambulatory Visit | Attending: Primary Care | Admitting: Primary Care

## 2021-03-29 DIAGNOSIS — I7121 Aneurysm of the ascending aorta, without rupture: Secondary | ICD-10-CM

## 2021-03-29 DIAGNOSIS — I700 Atherosclerosis of aorta: Secondary | ICD-10-CM | POA: Diagnosis not present

## 2021-03-29 DIAGNOSIS — R911 Solitary pulmonary nodule: Secondary | ICD-10-CM | POA: Diagnosis not present

## 2021-03-29 DIAGNOSIS — I712 Thoracic aortic aneurysm, without rupture, unspecified: Secondary | ICD-10-CM | POA: Diagnosis not present

## 2021-03-29 MED ORDER — IOPAMIDOL (ISOVUE-370) INJECTION 76%
75.0000 mL | Freq: Once | INTRAVENOUS | Status: AC | PRN
  Administered 2021-03-29: 60 mL via INTRAVENOUS

## 2021-03-29 NOTE — Telephone Encounter (Signed)
Martyn Ehrich, NP  03/29/2021  4:04 PM EST     The ascending aorta is unchanged when compared to prior studies, negative for thoracic aortic aneurysm. Unchanged subpleural nodule of the left and right lower lobe. These are unchanged, for greater than 4 years and consistent with benign etiology. Aortic Atherosclerosis. Continue routine follow-up with primary care.     Called and spoke with pt letting her know the results of recent CT and why she had the CT performed. Pt verbalized understanding. Nothing further needed.

## 2021-03-29 NOTE — Progress Notes (Signed)
The ascending aorta is unchanged when compared to prior studies, negative for thoracic aortic aneurysm. Unchanged subpleural nodule of the left and right lower lobe. These are unchanged, for greater than 4 years and consistent with benign etiology. Aortic Atherosclerosis. Continue routine follow-up with primary care.

## 2021-06-18 DIAGNOSIS — I10 Essential (primary) hypertension: Secondary | ICD-10-CM | POA: Diagnosis not present

## 2021-06-18 DIAGNOSIS — R051 Acute cough: Secondary | ICD-10-CM | POA: Diagnosis not present

## 2021-06-18 DIAGNOSIS — J069 Acute upper respiratory infection, unspecified: Secondary | ICD-10-CM | POA: Diagnosis not present

## 2021-06-18 DIAGNOSIS — N1832 Chronic kidney disease, stage 3b: Secondary | ICD-10-CM | POA: Diagnosis not present

## 2021-08-05 DIAGNOSIS — D1801 Hemangioma of skin and subcutaneous tissue: Secondary | ICD-10-CM | POA: Diagnosis not present

## 2021-08-05 DIAGNOSIS — D225 Melanocytic nevi of trunk: Secondary | ICD-10-CM | POA: Diagnosis not present

## 2021-08-05 DIAGNOSIS — L814 Other melanin hyperpigmentation: Secondary | ICD-10-CM | POA: Diagnosis not present

## 2021-08-05 DIAGNOSIS — L570 Actinic keratosis: Secondary | ICD-10-CM | POA: Diagnosis not present

## 2021-08-05 DIAGNOSIS — L821 Other seborrheic keratosis: Secondary | ICD-10-CM | POA: Diagnosis not present

## 2021-08-05 DIAGNOSIS — L820 Inflamed seborrheic keratosis: Secondary | ICD-10-CM | POA: Diagnosis not present

## 2021-08-16 DIAGNOSIS — N3010 Interstitial cystitis (chronic) without hematuria: Secondary | ICD-10-CM | POA: Diagnosis not present

## 2021-08-16 DIAGNOSIS — N393 Stress incontinence (female) (male): Secondary | ICD-10-CM | POA: Diagnosis not present

## 2021-08-22 DIAGNOSIS — N3946 Mixed incontinence: Secondary | ICD-10-CM | POA: Diagnosis not present

## 2021-08-22 DIAGNOSIS — K5900 Constipation, unspecified: Secondary | ICD-10-CM | POA: Diagnosis not present

## 2021-08-22 DIAGNOSIS — N3010 Interstitial cystitis (chronic) without hematuria: Secondary | ICD-10-CM | POA: Diagnosis not present

## 2021-08-22 DIAGNOSIS — M6289 Other specified disorders of muscle: Secondary | ICD-10-CM | POA: Diagnosis not present

## 2021-08-22 DIAGNOSIS — M6281 Muscle weakness (generalized): Secondary | ICD-10-CM | POA: Diagnosis not present

## 2021-08-22 DIAGNOSIS — M62838 Other muscle spasm: Secondary | ICD-10-CM | POA: Diagnosis not present

## 2021-08-27 DIAGNOSIS — M810 Age-related osteoporosis without current pathological fracture: Secondary | ICD-10-CM | POA: Diagnosis not present

## 2021-08-27 DIAGNOSIS — M1990 Unspecified osteoarthritis, unspecified site: Secondary | ICD-10-CM | POA: Diagnosis not present

## 2021-08-27 DIAGNOSIS — N1832 Chronic kidney disease, stage 3b: Secondary | ICD-10-CM | POA: Diagnosis not present

## 2021-08-27 DIAGNOSIS — I129 Hypertensive chronic kidney disease with stage 1 through stage 4 chronic kidney disease, or unspecified chronic kidney disease: Secondary | ICD-10-CM | POA: Diagnosis not present

## 2021-08-27 DIAGNOSIS — K219 Gastro-esophageal reflux disease without esophagitis: Secondary | ICD-10-CM | POA: Diagnosis not present

## 2021-08-27 DIAGNOSIS — I712 Thoracic aortic aneurysm, without rupture, unspecified: Secondary | ICD-10-CM | POA: Diagnosis not present

## 2021-08-27 DIAGNOSIS — I493 Ventricular premature depolarization: Secondary | ICD-10-CM | POA: Diagnosis not present

## 2021-08-27 DIAGNOSIS — E785 Hyperlipidemia, unspecified: Secondary | ICD-10-CM | POA: Diagnosis not present

## 2021-08-27 DIAGNOSIS — N3010 Interstitial cystitis (chronic) without hematuria: Secondary | ICD-10-CM | POA: Diagnosis not present

## 2021-08-27 DIAGNOSIS — R0901 Asphyxia: Secondary | ICD-10-CM | POA: Diagnosis not present

## 2021-10-07 DIAGNOSIS — R311 Benign essential microscopic hematuria: Secondary | ICD-10-CM | POA: Diagnosis not present

## 2021-10-07 DIAGNOSIS — N3946 Mixed incontinence: Secondary | ICD-10-CM | POA: Diagnosis not present

## 2021-10-23 DIAGNOSIS — M6281 Muscle weakness (generalized): Secondary | ICD-10-CM | POA: Diagnosis not present

## 2021-10-23 DIAGNOSIS — M62838 Other muscle spasm: Secondary | ICD-10-CM | POA: Diagnosis not present

## 2021-10-23 DIAGNOSIS — M6289 Other specified disorders of muscle: Secondary | ICD-10-CM | POA: Diagnosis not present

## 2021-10-23 DIAGNOSIS — N3946 Mixed incontinence: Secondary | ICD-10-CM | POA: Diagnosis not present

## 2021-10-23 DIAGNOSIS — K5900 Constipation, unspecified: Secondary | ICD-10-CM | POA: Diagnosis not present

## 2021-11-09 DIAGNOSIS — Z23 Encounter for immunization: Secondary | ICD-10-CM | POA: Diagnosis not present

## 2021-11-13 DIAGNOSIS — N3010 Interstitial cystitis (chronic) without hematuria: Secondary | ICD-10-CM | POA: Diagnosis not present

## 2021-11-13 DIAGNOSIS — M6281 Muscle weakness (generalized): Secondary | ICD-10-CM | POA: Diagnosis not present

## 2021-11-13 DIAGNOSIS — M62838 Other muscle spasm: Secondary | ICD-10-CM | POA: Diagnosis not present

## 2021-11-13 DIAGNOSIS — K5900 Constipation, unspecified: Secondary | ICD-10-CM | POA: Diagnosis not present

## 2021-11-13 DIAGNOSIS — M6289 Other specified disorders of muscle: Secondary | ICD-10-CM | POA: Diagnosis not present

## 2021-11-13 DIAGNOSIS — N3946 Mixed incontinence: Secondary | ICD-10-CM | POA: Diagnosis not present

## 2022-01-09 DIAGNOSIS — L820 Inflamed seborrheic keratosis: Secondary | ICD-10-CM | POA: Diagnosis not present

## 2022-01-09 DIAGNOSIS — L821 Other seborrheic keratosis: Secondary | ICD-10-CM | POA: Diagnosis not present

## 2022-01-16 DIAGNOSIS — N3946 Mixed incontinence: Secondary | ICD-10-CM | POA: Diagnosis not present

## 2022-01-16 DIAGNOSIS — M6281 Muscle weakness (generalized): Secondary | ICD-10-CM | POA: Diagnosis not present

## 2022-01-16 DIAGNOSIS — M6289 Other specified disorders of muscle: Secondary | ICD-10-CM | POA: Diagnosis not present

## 2022-01-16 DIAGNOSIS — K5900 Constipation, unspecified: Secondary | ICD-10-CM | POA: Diagnosis not present

## 2022-01-16 DIAGNOSIS — M62838 Other muscle spasm: Secondary | ICD-10-CM | POA: Diagnosis not present

## 2022-01-21 DIAGNOSIS — M810 Age-related osteoporosis without current pathological fracture: Secondary | ICD-10-CM | POA: Diagnosis not present

## 2022-01-21 DIAGNOSIS — Z780 Asymptomatic menopausal state: Secondary | ICD-10-CM | POA: Diagnosis not present

## 2022-01-21 DIAGNOSIS — M8589 Other specified disorders of bone density and structure, multiple sites: Secondary | ICD-10-CM | POA: Diagnosis not present

## 2022-01-21 DIAGNOSIS — Z1231 Encounter for screening mammogram for malignant neoplasm of breast: Secondary | ICD-10-CM | POA: Diagnosis not present

## 2022-01-28 ENCOUNTER — Other Ambulatory Visit: Payer: Self-pay | Admitting: Primary Care

## 2022-01-29 ENCOUNTER — Telehealth (HOSPITAL_BASED_OUTPATIENT_CLINIC_OR_DEPARTMENT_OTHER): Payer: Self-pay

## 2022-01-29 NOTE — Telephone Encounter (Signed)
Atc lvmtcb to schedule appt with Elsworth Soho

## 2022-03-03 DIAGNOSIS — I714 Abdominal aortic aneurysm, without rupture, unspecified: Secondary | ICD-10-CM

## 2022-03-03 HISTORY — DX: Abdominal aortic aneurysm, without rupture, unspecified: I71.40

## 2022-03-14 ENCOUNTER — Ambulatory Visit (HOSPITAL_BASED_OUTPATIENT_CLINIC_OR_DEPARTMENT_OTHER): Payer: Medicare Other | Admitting: Pulmonary Disease

## 2022-03-18 ENCOUNTER — Ambulatory Visit (INDEPENDENT_AMBULATORY_CARE_PROVIDER_SITE_OTHER): Payer: Medicare Other | Admitting: Pulmonary Disease

## 2022-03-18 ENCOUNTER — Encounter (HOSPITAL_BASED_OUTPATIENT_CLINIC_OR_DEPARTMENT_OTHER): Payer: Self-pay | Admitting: Pulmonary Disease

## 2022-03-18 VITALS — BP 118/66 | HR 95 | Temp 98.4°F | Ht 67.5 in | Wt 167.4 lb

## 2022-03-18 DIAGNOSIS — I7121 Aneurysm of the ascending aorta, without rupture: Secondary | ICD-10-CM | POA: Diagnosis not present

## 2022-03-18 DIAGNOSIS — R918 Other nonspecific abnormal finding of lung field: Secondary | ICD-10-CM | POA: Diagnosis not present

## 2022-03-18 NOTE — Progress Notes (Signed)
   Subjective:    Patient ID: Shelia [name], female    DOB: June 03, 1945, 77 y.o.   MRN: [id number]  HPI  77 yo  never smoker  for FU of abnormal CT scan and mild hypoxia noted in PCP office   Infiltrates due to reflux aspiration, nodule stable since 2018 and considered benign.  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 She was recovering from a sinus infection and also has severe reflux.   Still has occasional breakthrough reflux but episode has not recurred, no fevers. She obtained her first Covid shot. Remains on Protonix 40 mg twice daily   Chief Complaint  Patient presents with   Follow-up    Pt states she is still having some SOB with exertion. Pt is now seeing an Urologist for overactive bladder.    Last seen by me in 2021. She saw APP 12/2020.  CT scan 03/2021 shows unchanged size of aneurysm. She reports URI symptoms during Christmas and still has some hoarseness of voice, had productive phlegm, clear.  Did not receive antibiotic or steroids. Overall reflux is decreased, she has learned to control her foods including spicy and chocolate No wheezing.  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    CT angiogram from 01/2017 >> bibasilar atelectasis, small hiatal hernia and mildly prominent subcarinal lymph node No evidence of ILD on either scans  Review of Systems neg for any significant sore throat,  dysphagia, itching, sneezing, nasal congestion or excess/ purulent secretions, fever, chills, sweats, unintended wt loss, pleuritic or exertional cp, hempoptysis, orthopnea pnd or change in chronic leg swelling. Also denies presyncope, palpitations, heartburn, abdominal pain, nausea, vomiting, diarrhea or change in bowel or urinary habits, dysuria,hematuria, rash, arthralgias, visual complaints, headache, numbness weakness or ataxia.     Objective:   Physical Exam  Gen. Pleasant, well-nourished, in no distress ENT - no thrush, no pallor/icterus,no post nasal drip Neck: No JVD, no thyromegaly, no carotid bruits Lungs: no use of accessory muscles, no dullness to percussion, clear without rales or rhonchi  Cardiovascular: Rhythm regular, heart sounds  normal, no murmurs or gallops, no peripheral edema Musculoskeletal: No deformities, no cyanosis or clubbing        Assessment & Plan:

## 2022-03-18 NOTE — Assessment & Plan Note (Signed)
2-year follow-up imaging can be performed in January 2024

## 2022-03-18 NOTE — Assessment & Plan Note (Signed)
Previous infiltrates attributed to reflux/aspiration and resolved.  Some minimal secretions noted in right middle lobe on last CT. She is recovering from your infection and does not need antibiotic or steroids at this time

## 2022-03-18 NOTE — Patient Instructions (Signed)
2-year follow-up of aortic aneurysm in January 2024

## 2022-03-22 ENCOUNTER — Ambulatory Visit (HOSPITAL_BASED_OUTPATIENT_CLINIC_OR_DEPARTMENT_OTHER)
Admission: RE | Admit: 2022-03-22 | Discharge: 2022-03-22 | Disposition: A | Discharge status: Home / Self Care | Payer: Medicare Other | Source: Ambulatory Visit | Attending: Pulmonary Disease | Admitting: Pulmonary Disease

## 2022-03-22 DIAGNOSIS — I7121 Aneurysm of the ascending aorta, without rupture: Secondary | ICD-10-CM | POA: Insufficient documentation

## 2022-03-22 DIAGNOSIS — K449 Diaphragmatic hernia without obstruction or gangrene: Secondary | ICD-10-CM | POA: Diagnosis not present

## 2022-03-22 MED ORDER — IOHEXOL 350 MG/ML SOLN
100.0000 mL | Freq: Once | INTRAVENOUS | Status: AC | PRN
  Administered 2022-03-22: 70 mL via INTRAVENOUS

## 2022-04-07 DIAGNOSIS — E785 Hyperlipidemia, unspecified: Secondary | ICD-10-CM | POA: Diagnosis not present

## 2022-04-07 DIAGNOSIS — Z1212 Encounter for screening for malignant neoplasm of rectum: Secondary | ICD-10-CM | POA: Diagnosis not present

## 2022-04-07 DIAGNOSIS — R7989 Other specified abnormal findings of blood chemistry: Secondary | ICD-10-CM | POA: Diagnosis not present

## 2022-04-07 DIAGNOSIS — I10 Essential (primary) hypertension: Secondary | ICD-10-CM | POA: Diagnosis not present

## 2022-04-07 DIAGNOSIS — K219 Gastro-esophageal reflux disease without esophagitis: Secondary | ICD-10-CM | POA: Diagnosis not present

## 2022-04-07 DIAGNOSIS — N1832 Chronic kidney disease, stage 3b: Secondary | ICD-10-CM | POA: Diagnosis not present

## 2022-04-07 DIAGNOSIS — M810 Age-related osteoporosis without current pathological fracture: Secondary | ICD-10-CM | POA: Diagnosis not present

## 2022-04-10 DIAGNOSIS — N3946 Mixed incontinence: Secondary | ICD-10-CM | POA: Diagnosis not present

## 2022-04-14 DIAGNOSIS — Z23 Encounter for immunization: Secondary | ICD-10-CM | POA: Diagnosis not present

## 2022-04-14 DIAGNOSIS — R0901 Asphyxia: Secondary | ICD-10-CM | POA: Diagnosis not present

## 2022-04-14 DIAGNOSIS — R82998 Other abnormal findings in urine: Secondary | ICD-10-CM | POA: Diagnosis not present

## 2022-04-14 DIAGNOSIS — M48061 Spinal stenosis, lumbar region without neurogenic claudication: Secondary | ICD-10-CM | POA: Diagnosis not present

## 2022-04-14 DIAGNOSIS — I10 Essential (primary) hypertension: Secondary | ICD-10-CM | POA: Diagnosis not present

## 2022-04-14 DIAGNOSIS — I129 Hypertensive chronic kidney disease with stage 1 through stage 4 chronic kidney disease, or unspecified chronic kidney disease: Secondary | ICD-10-CM | POA: Diagnosis not present

## 2022-04-14 DIAGNOSIS — M810 Age-related osteoporosis without current pathological fracture: Secondary | ICD-10-CM | POA: Diagnosis not present

## 2022-04-14 DIAGNOSIS — I712 Thoracic aortic aneurysm, without rupture, unspecified: Secondary | ICD-10-CM | POA: Diagnosis not present

## 2022-04-14 DIAGNOSIS — M1990 Unspecified osteoarthritis, unspecified site: Secondary | ICD-10-CM | POA: Diagnosis not present

## 2022-04-14 DIAGNOSIS — Z1211 Encounter for screening for malignant neoplasm of colon: Secondary | ICD-10-CM | POA: Diagnosis not present

## 2022-04-14 DIAGNOSIS — N1832 Chronic kidney disease, stage 3b: Secondary | ICD-10-CM | POA: Diagnosis not present

## 2022-04-14 DIAGNOSIS — E785 Hyperlipidemia, unspecified: Secondary | ICD-10-CM | POA: Diagnosis not present

## 2022-04-14 DIAGNOSIS — N3010 Interstitial cystitis (chronic) without hematuria: Secondary | ICD-10-CM | POA: Diagnosis not present

## 2022-04-14 DIAGNOSIS — Z0000 Encounter for general adult medical examination without abnormal findings: Secondary | ICD-10-CM | POA: Diagnosis not present

## 2022-04-21 DIAGNOSIS — M545 Low back pain, unspecified: Secondary | ICD-10-CM | POA: Diagnosis not present

## 2022-04-23 DIAGNOSIS — M810 Age-related osteoporosis without current pathological fracture: Secondary | ICD-10-CM | POA: Diagnosis not present

## 2022-04-23 DIAGNOSIS — N1832 Chronic kidney disease, stage 3b: Secondary | ICD-10-CM | POA: Diagnosis not present

## 2022-04-29 ENCOUNTER — Encounter: Payer: Self-pay | Admitting: Internal Medicine

## 2022-05-07 ENCOUNTER — Other Ambulatory Visit (HOSPITAL_COMMUNITY): Payer: Self-pay

## 2022-05-08 ENCOUNTER — Ambulatory Visit (HOSPITAL_COMMUNITY)
Admission: RE | Admit: 2022-05-08 | Discharge: 2022-05-08 | Disposition: A | Discharge status: Home / Self Care | Payer: Medicare Other | Source: Ambulatory Visit | Attending: Internal Medicine | Admitting: Internal Medicine

## 2022-05-08 DIAGNOSIS — M810 Age-related osteoporosis without current pathological fracture: Secondary | ICD-10-CM | POA: Insufficient documentation

## 2022-05-08 MED ORDER — DENOSUMAB 60 MG/ML ~~LOC~~ SOSY
PREFILLED_SYRINGE | SUBCUTANEOUS | Status: AC
  Filled 2022-05-08: qty 1

## 2022-05-08 MED ORDER — DENOSUMAB 60 MG/ML ~~LOC~~ SOSY
60.0000 mg | PREFILLED_SYRINGE | Freq: Once | SUBCUTANEOUS | Status: AC
  Administered 2022-05-08: 60 mg via SUBCUTANEOUS

## 2022-05-31 DIAGNOSIS — M5416 Radiculopathy, lumbar region: Secondary | ICD-10-CM | POA: Diagnosis not present

## 2022-05-31 DIAGNOSIS — M79662 Pain in left lower leg: Secondary | ICD-10-CM | POA: Diagnosis not present

## 2022-06-04 DIAGNOSIS — Z981 Arthrodesis status: Secondary | ICD-10-CM | POA: Diagnosis not present

## 2022-06-04 DIAGNOSIS — M545 Low back pain, unspecified: Secondary | ICD-10-CM | POA: Diagnosis not present

## 2022-06-09 DIAGNOSIS — M5451 Vertebrogenic low back pain: Secondary | ICD-10-CM | POA: Diagnosis not present

## 2022-06-09 DIAGNOSIS — M545 Low back pain, unspecified: Secondary | ICD-10-CM | POA: Diagnosis not present

## 2022-06-11 DIAGNOSIS — M5451 Vertebrogenic low back pain: Secondary | ICD-10-CM | POA: Diagnosis not present

## 2022-06-14 DIAGNOSIS — M5416 Radiculopathy, lumbar region: Secondary | ICD-10-CM | POA: Diagnosis not present

## 2022-06-23 DIAGNOSIS — M5416 Radiculopathy, lumbar region: Secondary | ICD-10-CM | POA: Diagnosis not present

## 2022-06-23 DIAGNOSIS — G894 Chronic pain syndrome: Secondary | ICD-10-CM | POA: Diagnosis not present

## 2022-07-12 DIAGNOSIS — M5416 Radiculopathy, lumbar region: Secondary | ICD-10-CM | POA: Diagnosis not present

## 2022-08-16 DIAGNOSIS — M961 Postlaminectomy syndrome, not elsewhere classified: Secondary | ICD-10-CM | POA: Diagnosis not present

## 2022-08-16 DIAGNOSIS — M5416 Radiculopathy, lumbar region: Secondary | ICD-10-CM | POA: Diagnosis not present

## 2022-09-09 DIAGNOSIS — M5416 Radiculopathy, lumbar region: Secondary | ICD-10-CM | POA: Diagnosis not present

## 2022-09-09 DIAGNOSIS — Z6827 Body mass index (BMI) 27.0-27.9, adult: Secondary | ICD-10-CM | POA: Diagnosis not present

## 2022-09-17 ENCOUNTER — Other Ambulatory Visit: Payer: Self-pay | Admitting: Neurological Surgery

## 2022-09-26 NOTE — Pre-Procedure Instructions (Signed)
Surgical Instructions   Your procedure is scheduled on October 03, 2022. Report to [facility] Main Entrance "A" at 10:00 A.M., then check in with the Admitting office. Any questions or running late day of surgery: call [phone]  Questions prior to your surgery date: call [phone], Monday-Friday, 8am-4pm. If you experience any cold or flu symptoms such as cough, fever, chills, shortness of breath, etc. between now and your scheduled surgery, please notify us at the above number.     Remember:  Do not eat or drink after midnight the night before your surgery     Take these medicines the morning of surgery with A SIP OF WATER: amLODipine (NORVASC)  cetirizine (ZYRTEC)  gabapentin (NEURONTIN)  pantoprazole (PROTONIX)  rosuvastatin (CRESTOR)  tolterodine (DETROL LA)    May take these medicines IF NEEDED: acetaminophen (TYLENOL)  fluticasone (FLONASE) nasal spray  HYDROcodone-acetaminophen (NORCO/VICODIN)    Follow your surgeon's instructions on when to stop Aspirin.  If no instructions were given by your surgeon then you will need to call the office to get those instructions.     One week prior to surgery, STOP taking any Aleve, Naproxen, Ibuprofen, Motrin, Advil, Goody's, BC's, all herbal medications, fish oil, and non-prescription vitamins.                     Do NOT Smoke (Tobacco/Vaping) for 24 hours prior to your procedure.  If you use a CPAP at night, you may bring your mask/headgear for your overnight stay.   You will be asked to remove any contacts, glasses, piercing's, hearing aid's, dentures/partials prior to surgery. Please bring cases for these items if needed.    Patients discharged the day of surgery will not be allowed to drive home, and someone needs to stay with them for 24 hours.  SURGICAL WAITING ROOM VISITATION Patients may have no more than 2 support people in the waiting area - these visitors may rotate.   Pre-op nurse will coordinate an  appropriate time for 1 ADULT support person, who may not rotate, to accompany patient in pre-op.  Children under the age of 64 must have an adult with them who is not the patient and must remain in the main waiting area with an adult.  If the patient needs to stay at the hospital during part of their recovery, the visitor guidelines for inpatient rooms apply.  Please refer to the [facility] website for the visitor guidelines for any additional information.   If you received a COVID test during your pre-op visit  it is requested that you wear a mask when out in public, stay away from anyone that may not be feeling well and notify your surgeon if you develop symptoms. If you have been in contact with anyone that has tested positive in the last 10 days please notify you surgeon.      Pre-operative 5 CHG Bathing Instructions   You can play a key role in reducing the risk of infection after surgery. Your skin needs to be as free of germs as possible. You can reduce the number of germs on your skin by washing with CHG (chlorhexidine gluconate) soap before surgery. CHG is an antiseptic soap that kills germs and continues to kill germs even after washing.   DO NOT use if you have an allergy to chlorhexidine/CHG or antibacterial soaps. If your skin becomes reddened or irritated, stop using the CHG and notify one of our RNs at [phone].   Please shower with  the CHG soap starting 4 days before surgery using the following schedule:     Please keep in mind the following:  DO NOT shave, including legs and underarms, starting the day of your first shower.   You may shave your face at any point before/day of surgery.  Place clean sheets on your bed the day you start using CHG soap. Use a clean washcloth (not used since being washed) for each shower. DO NOT sleep with pets once you start using the CHG.   CHG Shower Instructions:  If you choose to wash your hair and private area, wash first with  your normal shampoo/soap.  After you use shampoo/soap, rinse your hair and body thoroughly to remove shampoo/soap residue.  Turn the water OFF and apply about 3 tablespoons (45 ml) of CHG soap to a CLEAN washcloth.  Apply CHG soap ONLY FROM YOUR NECK DOWN TO YOUR TOES (washing for 3-5 minutes)  DO NOT use CHG soap on face, private areas, open wounds, or sores.  Pay special attention to the area where your surgery is being performed.  If you are having back surgery, having someone wash your back for you may be helpful. Wait 2 minutes after CHG soap is applied, then you may rinse off the CHG soap.  Pat dry with a clean towel  Put on clean clothes/pajamas   If you choose to wear lotion, please use ONLY the CHG-compatible lotions on the back of this paper.   Additional instructions for the day of surgery: DO NOT APPLY any lotions, deodorants, cologne, or perfumes.   Do not bring valuables to the hospital. [facility] is not responsible for any belongings/valuables. Do not wear nail polish, gel polish, artificial nails, or any other type of covering on natural nails (fingers and toes) Do not wear jewelry or makeup Put on clean/comfortable clothes.  Please brush your teeth.  Ask your nurse before applying any prescription medications to the skin.     CHG Compatible Lotions   Aveeno Moisturizing lotion  Cetaphil Moisturizing Cream  Cetaphil Moisturizing Lotion  Clairol Herbal Essence Moisturizing Lotion, Dry Skin  Clairol Herbal Essence Moisturizing Lotion, Extra Dry Skin  Clairol Herbal Essence Moisturizing Lotion, Normal Skin  Curel Age Defying Therapeutic Moisturizing Lotion with Alpha Hydroxy  Curel Extreme Care Body Lotion  Curel Soothing Hands Moisturizing Hand Lotion  Curel Therapeutic Moisturizing Cream, Fragrance-Free  Curel Therapeutic Moisturizing Lotion, Fragrance-Free  Curel Therapeutic Moisturizing Lotion, Original Formula  Eucerin Daily Replenishing Lotion  Eucerin Dry  Skin Therapy Plus Alpha Hydroxy Crme  Eucerin Dry Skin Therapy Plus Alpha Hydroxy Lotion  Eucerin Original Crme  Eucerin Original Lotion  Eucerin Plus Crme Eucerin Plus Lotion  Eucerin TriLipid Replenishing Lotion  Keri Anti-Bacterial Hand Lotion  Keri Deep Conditioning Original Lotion Dry Skin Formula Softly Scented  Keri Deep Conditioning Original Lotion, Fragrance Free Sensitive Skin Formula  Keri Lotion Fast Absorbing Fragrance Free Sensitive Skin Formula  Keri Lotion Fast Absorbing Softly Scented Dry Skin Formula  Keri Original Lotion  Keri Skin Renewal Lotion Keri Silky Smooth Lotion  Keri Silky Smooth Sensitive Skin Lotion  Nivea Body Creamy Conditioning Oil  Nivea Body Extra Enriched Lotion  Nivea Body Original Lotion  Nivea Body Sheer Moisturizing Lotion Nivea Crme  Nivea Skin Firming Lotion  NutraDerm 30 Skin Lotion  NutraDerm Skin Lotion  NutraDerm Therapeutic Skin Cream  NutraDerm Therapeutic Skin Lotion  ProShield Protective Hand Cream  Provon moisturizing lotion  Please read over the following fact sheets that  you were given.

## 2022-09-29 ENCOUNTER — Encounter (HOSPITAL_COMMUNITY): Payer: Self-pay

## 2022-09-29 ENCOUNTER — Encounter (HOSPITAL_COMMUNITY)
Admission: RE | Admit: 2022-09-29 | Discharge: 2022-09-29 | Disposition: A | Discharge status: Home / Self Care | Payer: Medicare Other | Source: Ambulatory Visit | Attending: Neurological Surgery | Admitting: Neurological Surgery

## 2022-09-29 ENCOUNTER — Other Ambulatory Visit: Payer: Self-pay

## 2022-09-29 DIAGNOSIS — I2510 Atherosclerotic heart disease of native coronary artery without angina pectoris: Secondary | ICD-10-CM

## 2022-09-29 DIAGNOSIS — Z01818 Encounter for other preprocedural examination: Secondary | ICD-10-CM

## 2022-09-29 HISTORY — DX: Personal history of other diseases of the digestive system: Z87.19

## 2022-09-29 HISTORY — DX: Peripheral vascular disease, unspecified: I73.9

## 2022-09-29 LAB — TYPE AND SCREEN
ABO/RH(D): A POS
Antibody Screen: NEGATIVE

## 2022-09-29 LAB — BASIC METABOLIC PANEL
Anion gap: 12 (ref 5–15)
BUN: 17 mg/dL (ref 8–23)
CO2: 23 mmol/L (ref 22–32)
Calcium: 9.4 mg/dL (ref 8.9–10.3)
Chloride: 101 mmol/L (ref 98–111)
Creatinine, Ser: 1.12 mg/dL — ABNORMAL HIGH (ref 0.44–1.00)
GFR, Estimated: 51 mL/min — ABNORMAL LOW (ref >60)
Glucose, Bld: 96 mg/dL (ref 70–99)
Potassium: 3.3 mmol/L — ABNORMAL LOW (ref 3.5–5.1)
Sodium: 136 mmol/L (ref 135–145)

## 2022-09-29 LAB — CBC
HCT: 41.5 % (ref 36.0–46.0)
Hemoglobin: 13.3 g/dL (ref 12.0–15.0)
MCH: 27.8 pg (ref 26.0–34.0)
MCHC: 32 g/dL (ref 30.0–36.0)
MCV: 86.6 fL (ref 80.0–100.0)
Platelets: 300 10*3/uL (ref 150–400)
RBC: 4.79 MIL/uL (ref 3.87–5.11)
RDW: 12.5 % (ref 11.5–15.5)
WBC: 9 10*3/uL (ref 4.0–10.5)
nRBC: 0 % (ref 0.0–0.2)

## 2022-09-29 LAB — SURGICAL PCR SCREEN
MRSA, PCR: NEGATIVE
Staphylococcus aureus: NEGATIVE

## 2022-09-29 LAB — PROTIME-INR
INR: 0.9 (ref 0.8–1.2)
Prothrombin Time: 12.6 seconds (ref 11.4–15.2)

## 2022-09-29 NOTE — Progress Notes (Signed)
PCP - Dr. Chilton Greathouse Cardiologist - Denies Pulmonologist - Dr. Cyril Mourning  PPM/ICD - Denies Device Orders - n/a  Rep Notified - n/a  Chest x-ray - Denies - CT Chest completed 03/22/2022 EKG - 09/29/2022 Stress Test - Denies ECHO - 02/16/2017 - completed for syncopal episode. Result normal. Cardiac Cath - Denies  Sleep Study - Denies CPAP - n/a  No DM  Last dose of GLP1 agonist- n/a GLP1 instructions: n/a  Blood Thinner Instructions: n/a Aspirin Instructions: Per surgeon instructions, pts last dose was July 26th.  NPO after midnight  COVID TEST- n/a   Anesthesia review: No.  Patient denies shortness of breath, fever, cough and chest pain at PAT appointment. Pt denies any respiratory illness/infection in the last two months.   All instructions explained to the patient, with a verbal understanding of the material. Patient agrees to go over the instructions while at home for a better understanding. Patient also instructed to self quarantine after being tested for COVID-19. The opportunity to ask questions was provided.

## 2022-10-01 ENCOUNTER — Telehealth: Payer: Self-pay | Admitting: Pulmonary Disease

## 2022-10-02 HISTORY — PX: BACK SURGERY: SHX140

## 2022-10-02 NOTE — Progress Notes (Signed)
Left voicemail with new arrival time of 1300. Left call back number for short stay and OR if patient is unable to be here at that time.   Patient returned call at [phone] and voiced understanding of new arrival time of 1300 tomorrow.

## 2022-10-03 ENCOUNTER — Inpatient Hospital Stay (HOSPITAL_COMMUNITY)
Admission: RE | Admit: 2022-10-03 | Discharge: 2022-10-04 | DRG: 460 | Disposition: A | Discharge status: Home Health | Payer: Medicare Other | Source: Ambulatory Visit | Attending: Neurological Surgery | Admitting: Neurological Surgery

## 2022-10-03 ENCOUNTER — Inpatient Hospital Stay (HOSPITAL_COMMUNITY): Payer: Medicare Other | Admitting: Registered Nurse

## 2022-10-03 ENCOUNTER — Encounter (HOSPITAL_COMMUNITY)
Admission: RE | Disposition: A | Discharge status: Home Health | Payer: Self-pay | Source: Home / Self Care | Attending: Neurological Surgery

## 2022-10-03 ENCOUNTER — Inpatient Hospital Stay (HOSPITAL_COMMUNITY): Payer: Medicare Other

## 2022-10-03 ENCOUNTER — Encounter (HOSPITAL_COMMUNITY): Payer: Self-pay | Admitting: Neurological Surgery

## 2022-10-03 ENCOUNTER — Other Ambulatory Visit: Payer: Self-pay

## 2022-10-03 DIAGNOSIS — M48061 Spinal stenosis, lumbar region without neurogenic claudication: Secondary | ICD-10-CM | POA: Diagnosis not present

## 2022-10-03 DIAGNOSIS — I739 Peripheral vascular disease, unspecified: Secondary | ICD-10-CM | POA: Diagnosis present

## 2022-10-03 DIAGNOSIS — I10 Essential (primary) hypertension: Secondary | ICD-10-CM | POA: Diagnosis present

## 2022-10-03 DIAGNOSIS — M1990 Unspecified osteoarthritis, unspecified site: Secondary | ICD-10-CM | POA: Diagnosis present

## 2022-10-03 DIAGNOSIS — I129 Hypertensive chronic kidney disease with stage 1 through stage 4 chronic kidney disease, or unspecified chronic kidney disease: Secondary | ICD-10-CM | POA: Diagnosis not present

## 2022-10-03 DIAGNOSIS — K449 Diaphragmatic hernia without obstruction or gangrene: Secondary | ICD-10-CM | POA: Diagnosis present

## 2022-10-03 DIAGNOSIS — Z90710 Acquired absence of both cervix and uterus: Secondary | ICD-10-CM

## 2022-10-03 DIAGNOSIS — E785 Hyperlipidemia, unspecified: Secondary | ICD-10-CM | POA: Diagnosis not present

## 2022-10-03 DIAGNOSIS — Z8349 Family history of other endocrine, nutritional and metabolic diseases: Secondary | ICD-10-CM | POA: Diagnosis not present

## 2022-10-03 DIAGNOSIS — Z8249 Family history of ischemic heart disease and other diseases of the circulatory system: Secondary | ICD-10-CM

## 2022-10-03 DIAGNOSIS — Z981 Arthrodesis status: Secondary | ICD-10-CM | POA: Diagnosis not present

## 2022-10-03 DIAGNOSIS — Z9049 Acquired absence of other specified parts of digestive tract: Secondary | ICD-10-CM

## 2022-10-03 DIAGNOSIS — M5416 Radiculopathy, lumbar region: Secondary | ICD-10-CM | POA: Diagnosis not present

## 2022-10-03 DIAGNOSIS — Z79899 Other long term (current) drug therapy: Secondary | ICD-10-CM

## 2022-10-03 DIAGNOSIS — N189 Chronic kidney disease, unspecified: Secondary | ICD-10-CM

## 2022-10-03 DIAGNOSIS — I7121 Aneurysm of the ascending aorta, without rupture: Secondary | ICD-10-CM

## 2022-10-03 DIAGNOSIS — F32A Depression, unspecified: Secondary | ICD-10-CM | POA: Diagnosis present

## 2022-10-03 DIAGNOSIS — M4316 Spondylolisthesis, lumbar region: Secondary | ICD-10-CM | POA: Diagnosis not present

## 2022-10-03 DIAGNOSIS — Z86010 Personal history of colonic polyps: Secondary | ICD-10-CM

## 2022-10-03 DIAGNOSIS — Z7982 Long term (current) use of aspirin: Secondary | ICD-10-CM

## 2022-10-03 DIAGNOSIS — F419 Anxiety disorder, unspecified: Secondary | ICD-10-CM | POA: Diagnosis present

## 2022-10-03 DIAGNOSIS — I714 Abdominal aortic aneurysm, without rupture, unspecified: Secondary | ICD-10-CM | POA: Diagnosis present

## 2022-10-03 DIAGNOSIS — K219 Gastro-esophageal reflux disease without esophagitis: Secondary | ICD-10-CM | POA: Diagnosis present

## 2022-10-03 DIAGNOSIS — K589 Irritable bowel syndrome without diarrhea: Secondary | ICD-10-CM | POA: Diagnosis present

## 2022-10-03 SURGERY — POSTERIOR LUMBAR FUSION 1 WITH HARDWARE REMOVAL
Anesthesia: General | Site: Back

## 2022-10-03 MED ORDER — CEFAZOLIN SODIUM-DEXTROSE 2-4 GM/100ML-% IV SOLN
2.0000 g | Freq: Three times a day (TID) | INTRAVENOUS | Status: AC
  Administered 2022-10-03 – 2022-10-04 (×2): 2 g via INTRAVENOUS
  Filled 2022-10-03 (×2): qty 100

## 2022-10-03 MED ORDER — SURGIRINSE WOUND IRRIGATION SYSTEM - OPTIME
TOPICAL | Status: DC | PRN
  Administered 2022-10-03: 450 mL via TOPICAL

## 2022-10-03 MED ORDER — DEXAMETHASONE SODIUM PHOSPHATE 4 MG/ML IJ SOLN
4.0000 mg | Freq: Four times a day (QID) | INTRAMUSCULAR | Status: DC
  Administered 2022-10-03: 4 mg via INTRAVENOUS
  Filled 2022-10-03: qty 1

## 2022-10-03 MED ORDER — SENNA 8.6 MG PO TABS
1.0000 | ORAL_TABLET | Freq: Two times a day (BID) | ORAL | Status: DC
  Administered 2022-10-03 – 2022-10-04 (×2): 8.6 mg via ORAL
  Filled 2022-10-03 (×2): qty 1

## 2022-10-03 MED ORDER — ACETAMINOPHEN 160 MG/5ML PO SOLN: 1000.0000 mg | Freq: Once | ORAL | Status: DC | PRN

## 2022-10-03 MED ORDER — PHENOL 1.4 % MT LIQD: 1.0000 | OROMUCOSAL | Status: DC | PRN

## 2022-10-03 MED ORDER — MENTHOL 3 MG MT LOZG: 1.0000 | LOZENGE | OROMUCOSAL | Status: DC | PRN

## 2022-10-03 MED ORDER — ONDANSETRON HCL 4 MG/2ML IJ SOLN
INTRAMUSCULAR | Status: DC | PRN
Start: 2022-10-03 — End: 2022-10-03
  Administered 2022-10-03: 4 mg via INTRAVENOUS

## 2022-10-03 MED ORDER — CELECOXIB 200 MG PO CAPS
200.0000 mg | ORAL_CAPSULE | Freq: Two times a day (BID) | ORAL | Status: DC
  Administered 2022-10-03 – 2022-10-04 (×2): 200 mg via ORAL
  Filled 2022-10-03 (×2): qty 1

## 2022-10-03 MED ORDER — THROMBIN 5000 UNITS EX SOLR
CUTANEOUS | Status: AC
  Filled 2022-10-03: qty 5000

## 2022-10-03 MED ORDER — THROMBIN 20000 UNITS EX SOLR
CUTANEOUS | Status: DC | PRN
  Administered 2022-10-03: 20 mL via TOPICAL

## 2022-10-03 MED ORDER — CHLORHEXIDINE GLUCONATE CLOTH 2 % EX PADS: 6.0000 | MEDICATED_PAD | Freq: Once | CUTANEOUS | Status: DC

## 2022-10-03 MED ORDER — SCOPOLAMINE 1 MG/3DAYS TD PT72
MEDICATED_PATCH | TRANSDERMAL | Status: AC
  Filled 2022-10-03: qty 1

## 2022-10-03 MED ORDER — 0.9 % SODIUM CHLORIDE (POUR BTL) OPTIME
TOPICAL | Status: DC | PRN
  Administered 2022-10-03: 1000 mL

## 2022-10-03 MED ORDER — OXYCODONE HCL 5 MG/5ML PO SOLN: 5.0000 mg | Freq: Once | ORAL | Status: DC | PRN

## 2022-10-03 MED ORDER — THROMBIN 20000 UNITS EX SOLR
CUTANEOUS | Status: AC
  Filled 2022-10-03: qty 20000

## 2022-10-03 MED ORDER — BUPIVACAINE HCL (PF) 0.25 % IJ SOLN
INTRAMUSCULAR | Status: DC | PRN
  Administered 2022-10-03: 6 mL

## 2022-10-03 MED ORDER — GABAPENTIN 300 MG PO CAPS
300.0000 mg | ORAL_CAPSULE | Freq: Three times a day (TID) | ORAL | Status: DC
  Administered 2022-10-03 – 2022-10-04 (×2): 300 mg via ORAL
  Filled 2022-10-03 (×2): qty 1

## 2022-10-03 MED ORDER — ACETAMINOPHEN 500 MG PO TABS: 1000.0000 mg | ORAL_TABLET | Freq: Once | ORAL | Status: DC | PRN

## 2022-10-03 MED ORDER — HYDROCHLOROTHIAZIDE 12.5 MG PO TABS: 12.5000 mg | ORAL_TABLET | Freq: Every day | ORAL | Status: DC

## 2022-10-03 MED ORDER — PHENYLEPHRINE HCL-NACL 20-0.9 MG/250ML-% IV SOLN
INTRAVENOUS | Status: DC | PRN
  Administered 2022-10-03: 30 ug/min via INTRAVENOUS

## 2022-10-03 MED ORDER — EPHEDRINE 5 MG/ML INJ
INTRAVENOUS | Status: AC
  Filled 2022-10-03: qty 5

## 2022-10-03 MED ORDER — OXYCODONE HCL 5 MG PO TABS
5.0000 mg | ORAL_TABLET | ORAL | Status: DC | PRN
  Administered 2022-10-03 – 2022-10-04 (×5): 5 mg via ORAL
  Filled 2022-10-03 (×5): qty 1

## 2022-10-03 MED ORDER — ROCURONIUM BROMIDE 10 MG/ML (PF) SYRINGE
PREFILLED_SYRINGE | INTRAVENOUS | Status: DC | PRN
  Administered 2022-10-03: 40 mg via INTRAVENOUS
  Administered 2022-10-03 (×2): 10 mg via INTRAVENOUS

## 2022-10-03 MED ORDER — FENTANYL CITRATE (PF) 100 MCG/2ML IJ SOLN
INTRAMUSCULAR | Status: AC
  Filled 2022-10-03: qty 2

## 2022-10-03 MED ORDER — GABAPENTIN 300 MG PO CAPS
ORAL_CAPSULE | ORAL | Status: AC
  Administered 2022-10-03: 300 mg via ORAL
  Filled 2022-10-03: qty 1

## 2022-10-03 MED ORDER — MORPHINE SULFATE (PF) 2 MG/ML IV SOLN: 2.0000 mg | INTRAVENOUS | Status: DC | PRN

## 2022-10-03 MED ORDER — METHOCARBAMOL 1000 MG/10ML IJ SOLN: 500.0000 mg | Freq: Four times a day (QID) | INTRAVENOUS | Status: DC | PRN

## 2022-10-03 MED ORDER — SUGAMMADEX SODIUM 200 MG/2ML IV SOLN
INTRAVENOUS | Status: DC | PRN
  Administered 2022-10-03: 155.2 mg via INTRAVENOUS

## 2022-10-03 MED ORDER — KETAMINE HCL 50 MG/5ML IJ SOSY
PREFILLED_SYRINGE | INTRAMUSCULAR | Status: AC
  Filled 2022-10-03: qty 5

## 2022-10-03 MED ORDER — ONDANSETRON HCL 4 MG PO TABS: 4.0000 mg | ORAL_TABLET | Freq: Four times a day (QID) | ORAL | Status: DC | PRN

## 2022-10-03 MED ORDER — ASPIRIN 81 MG PO TBEC
81.0000 mg | DELAYED_RELEASE_TABLET | Freq: Every day | ORAL | Status: DC
  Administered 2022-10-04: 81 mg via ORAL
  Filled 2022-10-03: qty 1

## 2022-10-03 MED ORDER — ACETAMINOPHEN 500 MG PO TABS
1000.0000 mg | ORAL_TABLET | Freq: Four times a day (QID) | ORAL | Status: DC
  Administered 2022-10-03 – 2022-10-04 (×3): 1000 mg via ORAL
  Filled 2022-10-03 (×3): qty 2

## 2022-10-03 MED ORDER — CEFAZOLIN SODIUM-DEXTROSE 2-4 GM/100ML-% IV SOLN
INTRAVENOUS | Status: AC
  Filled 2022-10-03: qty 100

## 2022-10-03 MED ORDER — DEXAMETHASONE 4 MG PO TABS
4.0000 mg | ORAL_TABLET | Freq: Four times a day (QID) | ORAL | Status: DC
  Administered 2022-10-03 – 2022-10-04 (×2): 4 mg via ORAL
  Filled 2022-10-03 (×2): qty 1

## 2022-10-03 MED ORDER — PHENYLEPHRINE HCL-NACL 20-0.9 MG/250ML-% IV SOLN
INTRAVENOUS | Status: AC
  Filled 2022-10-03: qty 250

## 2022-10-03 MED ORDER — PROPOFOL 10 MG/ML IV BOLUS
INTRAVENOUS | Status: DC | PRN
Start: 2022-10-03 — End: 2022-10-03
  Administered 2022-10-03: 100 mg via INTRAVENOUS

## 2022-10-03 MED ORDER — ACETAMINOPHEN 500 MG PO TABS: 1000.0000 mg | ORAL_TABLET | ORAL | Status: AC

## 2022-10-03 MED ORDER — POTASSIUM CHLORIDE IN NACL 20-0.9 MEQ/L-% IV SOLN: INTRAVENOUS | Status: DC

## 2022-10-03 MED ORDER — KETAMINE HCL 10 MG/ML IJ SOLN
INTRAMUSCULAR | Status: DC | PRN
Start: 2022-10-03 — End: 2022-10-03
  Administered 2022-10-03: 20 mg via INTRAVENOUS

## 2022-10-03 MED ORDER — OXYCODONE HCL 5 MG PO TABS: 5.0000 mg | ORAL_TABLET | Freq: Once | ORAL | Status: DC | PRN

## 2022-10-03 MED ORDER — SODIUM CHLORIDE 0.9 % IV SOLN: 250.0000 mL | INTRAVENOUS | Status: DC

## 2022-10-03 MED ORDER — SODIUM CHLORIDE 0.9% FLUSH: 3.0000 mL | INTRAVENOUS | Status: DC | PRN

## 2022-10-03 MED ORDER — ZOLPIDEM TARTRATE 5 MG PO TABS
5.0000 mg | ORAL_TABLET | Freq: Every evening | ORAL | Status: DC | PRN
  Administered 2022-10-03: 5 mg via ORAL
  Filled 2022-10-03: qty 1

## 2022-10-03 MED ORDER — ORAL CARE MOUTH RINSE: 15.0000 mL | Freq: Once | OROMUCOSAL | Status: AC

## 2022-10-03 MED ORDER — ONDANSETRON HCL 4 MG/2ML IJ SOLN
4.0000 mg | Freq: Four times a day (QID) | INTRAMUSCULAR | Status: DC | PRN
  Administered 2022-10-03: 4 mg via INTRAVENOUS
  Filled 2022-10-03: qty 2

## 2022-10-03 MED ORDER — ONDANSETRON HCL 4 MG/2ML IJ SOLN
INTRAMUSCULAR | Status: AC
  Filled 2022-10-03: qty 2

## 2022-10-03 MED ORDER — CEFAZOLIN SODIUM-DEXTROSE 2-4 GM/100ML-% IV SOLN
2.0000 g | INTRAVENOUS | Status: AC
  Administered 2022-10-03: 2 g via INTRAVENOUS

## 2022-10-03 MED ORDER — METHOCARBAMOL 500 MG PO TABS
500.0000 mg | ORAL_TABLET | Freq: Four times a day (QID) | ORAL | Status: DC | PRN
  Administered 2022-10-03 – 2022-10-04 (×2): 500 mg via ORAL
  Filled 2022-10-03 (×2): qty 1

## 2022-10-03 MED ORDER — CHLORHEXIDINE GLUCONATE 0.12 % MT SOLN
OROMUCOSAL | Status: AC
  Administered 2022-10-03: 15 mL via OROMUCOSAL
  Filled 2022-10-03: qty 15

## 2022-10-03 MED ORDER — CHLORHEXIDINE GLUCONATE 0.12 % MT SOLN: 15.0000 mL | Freq: Once | OROMUCOSAL | Status: AC

## 2022-10-03 MED ORDER — FESOTERODINE FUMARATE ER 4 MG PO TB24
4.0000 mg | ORAL_TABLET | Freq: Every day | ORAL | Status: DC
  Filled 2022-10-03: qty 1

## 2022-10-03 MED ORDER — FENTANYL CITRATE (PF) 250 MCG/5ML IJ SOLN
INTRAMUSCULAR | Status: DC | PRN
  Administered 2022-10-03: 100 ug via INTRAVENOUS
  Administered 2022-10-03 (×2): 25 ug via INTRAVENOUS

## 2022-10-03 MED ORDER — FENTANYL CITRATE (PF) 250 MCG/5ML IJ SOLN
INTRAMUSCULAR | Status: AC
  Filled 2022-10-03: qty 5

## 2022-10-03 MED ORDER — AMLODIPINE BESYLATE 5 MG PO TABS: 5.0000 mg | ORAL_TABLET | Freq: Every day | ORAL | Status: DC

## 2022-10-03 MED ORDER — ACETAMINOPHEN 500 MG PO TABS
ORAL_TABLET | ORAL | Status: AC
  Administered 2022-10-03: 1000 mg via ORAL
  Filled 2022-10-03: qty 2

## 2022-10-03 MED ORDER — FENTANYL CITRATE (PF) 100 MCG/2ML IJ SOLN
25.0000 ug | INTRAMUSCULAR | Status: DC | PRN
  Administered 2022-10-03: 25 ug via INTRAVENOUS
  Administered 2022-10-03: 50 ug via INTRAVENOUS
  Administered 2022-10-03: 25 ug via INTRAVENOUS
  Administered 2022-10-03: 50 ug via INTRAVENOUS

## 2022-10-03 MED ORDER — PHENYLEPHRINE 80 MCG/ML (10ML) SYRINGE FOR IV PUSH (FOR BLOOD PRESSURE SUPPORT)
PREFILLED_SYRINGE | INTRAVENOUS | Status: DC | PRN
  Administered 2022-10-03: 160 ug via INTRAVENOUS
  Administered 2022-10-03 (×3): 80 ug via INTRAVENOUS

## 2022-10-03 MED ORDER — DEXAMETHASONE SODIUM PHOSPHATE 10 MG/ML IJ SOLN
INTRAMUSCULAR | Status: DC | PRN
  Administered 2022-10-03: 10 mg via INTRAVENOUS

## 2022-10-03 MED ORDER — GABAPENTIN 300 MG PO CAPS: 300.0000 mg | ORAL_CAPSULE | ORAL | Status: AC

## 2022-10-03 MED ORDER — BUPIVACAINE HCL (PF) 0.25 % IJ SOLN
INTRAMUSCULAR | Status: AC
  Filled 2022-10-03: qty 30

## 2022-10-03 MED ORDER — ACETAMINOPHEN 10 MG/ML IV SOLN: 1000.0000 mg | Freq: Once | INTRAVENOUS | Status: DC | PRN

## 2022-10-03 MED ORDER — LIDOCAINE 2% (20 MG/ML) 5 ML SYRINGE
INTRAMUSCULAR | Status: DC | PRN
  Administered 2022-10-03: 40 mg via INTRAVENOUS

## 2022-10-03 MED ORDER — SODIUM CHLORIDE 0.9% FLUSH: 3.0000 mL | Freq: Two times a day (BID) | INTRAVENOUS | Status: DC

## 2022-10-03 MED ORDER — PANTOPRAZOLE SODIUM 40 MG PO TBEC
40.0000 mg | DELAYED_RELEASE_TABLET | Freq: Every day | ORAL | Status: DC
  Administered 2022-10-04: 40 mg via ORAL
  Filled 2022-10-03: qty 1

## 2022-10-03 MED ORDER — LACTATED RINGERS IV SOLN: INTRAVENOUS | Status: DC

## 2022-10-03 MED ORDER — IRBESARTAN 75 MG PO TABS
75.0000 mg | ORAL_TABLET | Freq: Every day | ORAL | Status: DC
  Administered 2022-10-03: 75 mg via ORAL
  Filled 2022-10-03: qty 1

## 2022-10-03 MED ORDER — THROMBIN 5000 UNITS EX SOLR
OROMUCOSAL | Status: DC | PRN
  Administered 2022-10-03: 5 mL via TOPICAL

## 2022-10-03 SURGICAL SUPPLY — 62 items
ADH SKN CLS APL DERMABOND .7 (GAUZE/BANDAGES/DRESSINGS) ×1
APL SKNCLS STERI-STRIP NONHPOA (GAUZE/BANDAGES/DRESSINGS) ×1
BAG COUNTER SPONGE SURGICOUNT (BAG) ×1 IMPLANT
BAG SPNG CNTER NS LX DISP (BAG) ×1
BASKET BONE COLLECTION (BASKET) ×1 IMPLANT
BENZOIN TINCTURE PRP APPL 2/3 (GAUZE/BANDAGES/DRESSINGS) ×1 IMPLANT
BLADE BONE MILL MEDIUM (MISCELLANEOUS) ×1 IMPLANT
BLADE CLIPPER SURG (BLADE) IMPLANT
BUR CARBIDE MATCH 3.0 (BURR) ×1 IMPLANT
CANISTER SUCT 3000ML PPV (MISCELLANEOUS) ×1 IMPLANT
CNTNR URN SCR LID CUP LEK RST (MISCELLANEOUS) ×1 IMPLANT
CONT SPEC 4OZ STRL OR WHT (MISCELLANEOUS) ×1
COVER BACK TABLE 60X90IN (DRAPES) ×1 IMPLANT
DERMABOND ADVANCED .7 DNX12 (GAUZE/BANDAGES/DRESSINGS) ×1 IMPLANT
DRAPE C-ARM 42X72 X-RAY (DRAPES) ×1 IMPLANT
DRAPE C-ARMOR (DRAPES) ×2 IMPLANT
DRAPE LAPAROTOMY 100X72X124 (DRAPES) ×1 IMPLANT
DRAPE SURG 17X23 STRL (DRAPES) ×1 IMPLANT
DRSG OPSITE POSTOP 4X6 (GAUZE/BANDAGES/DRESSINGS) IMPLANT
DURAPREP 26ML APPLICATOR (WOUND CARE) ×1 IMPLANT
ELECT REM PT RETURN 9FT ADLT (ELECTROSURGICAL) ×1
ELECTRODE REM PT RTRN 9FT ADLT (ELECTROSURGICAL) ×1 IMPLANT
EVACUATOR 1/8 PVC DRAIN (DRAIN) ×1 IMPLANT
GAUZE 4X4 16PLY ~~LOC~~+RFID DBL (SPONGE) IMPLANT
GLOVE BIO SURGEON STRL SZ7 (GLOVE) IMPLANT
GLOVE BIO SURGEON STRL SZ8 (GLOVE) ×2 IMPLANT
GLOVE BIOGEL PI IND STRL 7.0 (GLOVE) IMPLANT
GOWN STRL REUS W/ TWL LRG LVL3 (GOWN DISPOSABLE) IMPLANT
GOWN STRL REUS W/ TWL XL LVL3 (GOWN DISPOSABLE) ×2 IMPLANT
GOWN STRL REUS W/TWL 2XL LVL3 (GOWN DISPOSABLE) IMPLANT
GOWN STRL REUS W/TWL LRG LVL3 (GOWN DISPOSABLE)
GOWN STRL REUS W/TWL XL LVL3 (GOWN DISPOSABLE) ×2
GRAFT TRIN ELITE MED MUSC TRAN (Graft) IMPLANT
HEMOSTAT POWDER KIT SURGIFOAM (HEMOSTASIS) ×1 IMPLANT
KIT BASIN OR (CUSTOM PROCEDURE TRAY) ×1 IMPLANT
KIT GRAFTMAG DEL NEURO DISP (NEUROSURGERY SUPPLIES) IMPLANT
KIT POSITION SURG JACKSON T1 (MISCELLANEOUS) ×1 IMPLANT
KIT TURNOVER KIT B (KITS) ×1 IMPLANT
MILL BONE PREP (MISCELLANEOUS) ×1 IMPLANT
NDL HYPO 25X1 1.5 SAFETY (NEEDLE) ×1 IMPLANT
NEEDLE HYPO 25X1 1.5 SAFETY (NEEDLE) ×1 IMPLANT
NS IRRIG 1000ML POUR BTL (IV SOLUTION) ×1 IMPLANT
PACK LAMINECTOMY NEURO (CUSTOM PROCEDURE TRAY) ×1 IMPLANT
PAD ARMBOARD 7.5X6 YLW CONV (MISCELLANEOUS) ×3 IMPLANT
ROD RELINE LORDOTIC 5.5X45 (Rod) IMPLANT
ROD RELINE-O LORD 5.5X50MM (Rod) IMPLANT
SCREW LOCK RELINE 5.5 TULIP (Screw) IMPLANT
SCREW RELINE-O POLY 6.5X45 (Screw) IMPLANT
SOLUTION IRRIG SURGIPHOR (IV SOLUTION) ×1 IMPLANT
SPACER SUSTAIN TI 8X22X210 8D (Spacer) IMPLANT
SPONGE SURGIFOAM ABS GEL 100 (HEMOSTASIS) ×1 IMPLANT
SPONGE T-LAP 4X18 ~~LOC~~+RFID (SPONGE) IMPLANT
STRIP CLOSURE SKIN 1/2X4 (GAUZE/BANDAGES/DRESSINGS) ×2 IMPLANT
SUT VIC AB 0 CT1 18XCR BRD8 (SUTURE) ×1 IMPLANT
SUT VIC AB 0 CT1 8-18 (SUTURE) ×1
SUT VIC AB 2-0 CP2 18 (SUTURE) ×1 IMPLANT
SUT VIC AB 3-0 SH 8-18 (SUTURE) ×2 IMPLANT
SYR CONTROL 10ML LL (SYRINGE) ×1 IMPLANT
TOWEL GREEN STERILE (TOWEL DISPOSABLE) ×1 IMPLANT
TOWEL GREEN STERILE FF (TOWEL DISPOSABLE) ×1 IMPLANT
TRAY FOLEY MTR SLVR 16FR STAT (SET/KITS/TRAYS/PACK) ×1 IMPLANT
WATER STERILE IRR 1000ML POUR (IV SOLUTION) ×1 IMPLANT

## 2022-10-03 NOTE — Op Note (Signed)
10/03/2022  5:13 PM  PATIENT:  Shelia [name]  77 y.o. 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 Decompressive lumbar laminectomy, hemi facetectomy and foraminotomies L2-3 requiring more work than would be required for a simple exposure of the disk for PLIF in order to adequately decompress the neural elements and address the spinal stenosis 2. Posterior lumbar interbody fusion L2-3 using titanium interbody cages packed with morcellized allograft and autograft  3. Posterior fixation L2-3 using NuVasive  pedicle screws.  4. Intertransverse arthrodesis L2-3 using morcellized autograft and allograft. 5.  Exploration of fusion L3-L5 with removal of segmental fixation L3-L5  SURGEON:  Marikay Alar, MD  ASSISTANTS: Verlin Dike, FNP  ANESTHESIA:  General  EBL: 100 ml  Total I/O In: 1100 [I.V.:1000; IV Piggyback:100] Out: 300 [Urine:200; Blood:100]  BLOOD ADMINISTERED:none  DRAINS: none   INDICATION FOR PROCEDURE: This patient presented with back pain with bilateral leg pain left much more than right. Imaging revealed the adjacent level stenosis L2-3 with retrolisthesis above previous successful L3-L5 fusion. The patient tried a reasonable attempt at conservative medical measures without relief. I recommended decompression and instrumented fusion to address the stenosis as well as the segmental  instability.  Patient understood the risks, benefits, and alternatives and potential outcomes and wished to proceed.  PROCEDURE DETAILS:  The patient was brought to the operating room. After induction of generalized endotracheal anesthesia the patient was rolled into the prone position on chest rolls and all pressure points were padded. The patient's lumbar region was cleaned and then prepped with DuraPrep and draped in the usual sterile fashion. Anesthesia was injected and then  a dorsal midline incision was made and carried down to the lumbosacral fascia. The fascia was opened and the paraspinous musculature was taken down in a subperiosteal fashion to expose to 3 as well as the previously placed instrumentation. A self-retaining retractor was placed. Intraoperative fluoroscopy confirmed my level, and I started with removal of the old instrumentation.  Remove the locking caps from L3-L4 and L5 and then remove the rods.  The screws appear to have good purchase.   I then turned my attention to the decompression and complete lumbar laminectomies, hemi- facetectomies, and foraminotomies were performed at L2-3.  My nurse practitioner was directly involved in the decompression and exposure of the neural elements. the patient had significant spinal stenosis and this required more work than would be required for a simple exposure of the disc for posterior lumbar interbody fusion which would only require a limited laminotomy. Much more generous decompression and generous foraminotomy was undertaken in order to adequately decompress the neural elements and address the patient's leg pain. The yellow ligament was removed to expose the underlying dura and nerve roots, and generous foraminotomies were performed to adequately decompress the neural elements. Both the exiting and traversing nerve roots were decompressed on both sides until a coronary dilator passed easily along the nerve roots. Once the decompression was complete, I turned my attention to the posterior lower lumbar interbody fusion. The epidural venous vasculature was coagulated and cut sharply. Disc space was incised and the initial discectomy was performed with pituitary rongeurs. The disc space was distracted with sequential distractors to a height of 11 mm. We then used a series of scrapers and shavers to prepare the endplates for fusion. The midline was prepared with Epstein curettes. Once the complete discectomy was finished, we  packed an appropriate sized interbody cage with local autograft and morcellized allograft, gently retracted the nerve root, and tapped the cage into position at L2-3.  The midline between the cages was packed with morselized autograft and allograft.   We then turned our attention to the placement of the L2 pedicle screws. The pedicle screw entry zones were identified utilizing surface landmarks and fluoroscopy. I drilled into each pedicle utilizing the hand drill, rolled each pedicle with the pedicle probe, tapped each pedicle with the appropriate tap. We palpated with a ball probe to assure no break in the cortex. We then placed a 6.5 x 45 mm pedicle screws into the pedicles bilaterally at L2.  My nurse practitioner assisted in placement of the pedicle screws.  We then decorticated the transverse processes and laid a mixture of morcellized autograft and allograft out over these to perform intertransverse arthrodesis at L2-3. We then placed lordotic rods into the multiaxial screw heads of the pedicle screws and locked these in position with the locking caps and anti-torque device. We then checked our construct with AP and lateral fluoroscopy. Irrigated with copious amounts of 0.5% povidone iodine solution followed by saline solution. Inspected the nerve roots once again to assure adequate decompression, lined to the dura with Gelfoam,  and then we closed the muscle and the fascia with 0 Vicryl. Closed the subcutaneous tissues with 2-0 Vicryl and subcuticular tissues with 3-0 Vicryl. The skin was closed with benzoin and Steri-Strips. Dressing was then applied, the patient was awakened from general anesthesia and transported to the recovery room in stable condition. At the end of the procedure all sponge, needle and instrument counts were correct.   PLAN OF CARE: admit to inpatient  PATIENT DISPOSITION:  PACU - hemodynamically stable.   Delay start of Pharmacological VTE agent (>24hrs) due to surgical blood  loss or risk of bleeding:  yes

## 2022-10-03 NOTE — Anesthesia Preprocedure Evaluation (Signed)
Anesthesia Evaluation  Patient identified by MRN, date of birth, ID band Patient awake    Reviewed: Allergy & Precautions, NPO status , Patient's Chart, lab work & pertinent test results  History of Anesthesia Complications Negative for: history of anesthetic complications  Airway Mallampati: III  TM Distance: >3 FB Neck ROM: Full    Dental  (+) Teeth Intact, Dental Advisory Given,    Pulmonary neg shortness of breath, neg sleep apnea, neg COPD, neg recent URI   breath sounds clear to auscultation       Cardiovascular hypertension, Pt. on medications (-) angina + Peripheral Vascular Disease  (-) Past MI and (-) CHF (-) dysrhythmias  Rhythm:Regular     Neuro/Psych negative neurological ROS     GI/Hepatic Neg liver ROS, hiatal hernia,GERD  Medicated and Controlled,,  Endo/Other  negative endocrine ROS  No results found for: "HGBA1C"   Renal/GU Renal InsufficiencyRenal diseaseLab Results      Component                Value               Date                      CREATININE               1.12 (H)            09/29/2022                Musculoskeletal  (+) Arthritis ,    Abdominal   Peds  Hematology negative hematology ROS (+) Lab Results      Component                Value               Date                      WBC                      9.0                 09/29/2022                HGB                      13.3                09/29/2022                HCT                      41.5                09/29/2022                MCV                      86.6                09/29/2022                PLT                      300                 09/29/2022  Anesthesia Other Findings   Reproductive/Obstetrics                             Anesthesia Physical Anesthesia Plan  ASA: 3  Anesthesia Plan: General   Post-op Pain Management: Tylenol PO (pre-op)* and Gabapentin PO (pre-op)*    Induction: Intravenous  PONV Risk Score and Plan: 4 or greater and Ondansetron and Dexamethasone  Airway Management Planned: Oral ETT  Additional Equipment: None  Intra-op Plan:   Post-operative Plan: Extubation in OR  Informed Consent: I have reviewed the patients History and Physical, chart, labs and discussed the procedure including the risks, benefits and alternatives for the proposed anesthesia with the patient or authorized representative who has indicated his/her understanding and acceptance.     Dental advisory given  Plan Discussed with: CRNA and Anesthesiologist  Anesthesia Plan Comments:        Anesthesia Quick Evaluation

## 2022-10-03 NOTE — H&P (Signed)
Subjective: Patient is a 77 y.o. female admitted for back and L leg pain. Onset of symptoms was several months ago, gradually worsening since that time.  The pain is rated severe, unremitting, and is located at the across the lower back and radiates to LLE. The pain is described as aching and occurs all day. The symptoms have been progressive. Symptoms are exacerbated by exercise, standing, and walking for more than a few minutes. MRI or CT showed adjacent level stenosis L2-3 with retrolisthesis   Past Medical History:  Diagnosis Date   AAA (abdominal aortic aneurysm) without rupture (HCC) 03/2022   Anxiety    Cystitis    Depression    DJD (degenerative joint disease)    GERD (gastroesophageal reflux disease)    History of hiatal hernia    small to moderate per 2024 Scan   Hyperlipidemia    Hypertension    Irritable bowel syndrome (IBS)    Peripheral vascular disease (HCC)    AAA   PONV (postoperative nausea and vomiting)    with gallbladder surgery 2014   Spinal stenosis     Past Surgical History:  Procedure Laterality Date   ABDOMINAL HYSTERECTOMY  03/04/1979   [facility]   ANTERIOR LAT LUMBAR FUSION N/A 04/22/2017   Procedure: ANTERIOR LATERAL LUMBAR FUSION (XLIF) with posterior spinal fusion interbody L3-5;  Surgeon: Venita Lick, MD;  Location: [facility] OR;  Service: Orthopedics;  Laterality: N/A;  6 hrs   BACK SURGERY  03/04/1995   l-5,s-1   BACK SURGERY  03/03/1998   l-5,s-1   BACK SURGERY  03/03/2000   l-5,s-1   BACK SURGERY  03/03/2001   l-5,s-2   CERVICAL SPINE SURGERY  03/04/1987   c-4,c-5   CERVICAL SPINE SURGERY  03/03/1992   c-4,c-5   CHOLECYSTECTOMY     lap   COLONOSCOPY  03/03/2006   polyps   FRACTURE SURGERY Left    Wrist   hemorrhoid banding  03/03/2009   LUMBAR LAMINECTOMY/DECOMPRESSION MICRODISCECTOMY N/A 09/11/2016   Procedure: Lumbar three-five decompression, insitu fusion Lumbar fhree-five,  removal of lumbar hardware Lumbar five-Sacral one;  Surgeon:  Venita Lick, MD;  Location: [facility] OR;  Service: Orthopedics;  Laterality: N/A;  3.5 hrs   TONSILLECTOMY     As a child    Prior to Admission medications   Medication Sig Start Date End Date Taking? Authorizing Provider  acetaminophen (TYLENOL) 650 MG CR tablet Take 650 mg by mouth every 8 (eight) hours as needed for pain.   Yes [provider]  amLODipine (NORVASC) 5 MG tablet Take 5 mg by mouth daily.   Yes [provider]  aspirin EC 81 MG tablet Take 81 mg by mouth daily.   Yes [provider]  Biotin 5000 MCG TABS Take 5,000 mcg by mouth daily.   Yes [provider]  Calcium-Magnesium-Vitamin D (CALCIUM 1200+D3 PO) Take 1 tablet by mouth daily.   Yes [provider]  cetirizine (ZYRTEC) 10 MG tablet Take 10 mg by mouth daily.   Yes [provider]  cholecalciferol (VITAMIN D) 1000 UNITS tablet Take 1,000 Units by mouth daily.   Yes [provider]  fish oil-omega-3 fatty acids 1000 MG capsule Take 1 g by mouth daily.    Yes [provider]  fluticasone (FLONASE) 50 MCG/ACT nasal spray Place 2 sprays into both nostrils daily as needed for allergies or rhinitis.   Yes [provider]  gabapentin (NEURONTIN) 300 MG capsule Take 300 mg by mouth 3 (three)  times daily.   Yes [provider]  hydrochlorothiazide (HYDRODIURIL) 25 MG tablet Take 12.5 mg by mouth daily. Dose varies based on fluid retention/blood pressure.   Yes [provider]  HYDROcodone-acetaminophen (NORCO/VICODIN) 5-325 MG tablet Take 1 tablet by mouth 3 (three) times daily as needed for severe pain.   Yes [provider]  Multiple Vitamins-Minerals (MULTIVITAMIN WITH MINERALS) tablet Take 1 tablet by mouth daily.   Yes [provider]  pantoprazole (PROTONIX) 40 MG tablet TAKE 1 TABLET EVERY DAY 01/29/22  Yes Glenford Bayley, NP  rosuvastatin (CRESTOR) 5 MG tablet Take 5 mg by mouth daily. 01/06/19  Yes [provider]  telmisartan (MICARDIS) 80 MG tablet Take 80 mg by mouth daily. 09/28/19  Yes [provider]  tolterodine (DETROL LA) 4 MG 24 hr capsule Take 4 mg by mouth daily.   Yes [provider]  zolpidem (AMBIEN) 10 MG tablet Take 10 mg by mouth at bedtime.   Yes [provider]   No Known Allergies  Social History   Tobacco Use   Smoking status: Never   Smokeless tobacco: Never  Substance Use Topics   Alcohol use: No    Family History  Problem Relation Age of Onset   Heart disease Mother    Heart disease Father    Hemochromatosis Daughter      Review of Systems  Positive ROS: neg  All other systems have been reviewed and were otherwise negative with the exception of those mentioned in the HPI and as above.  Objective: Vital signs in last 24 hours: Temp:  [98.2 F (36.8 C)] 98.2 F (36.8 C) (08/02 1245) Pulse Rate:  [93] 93 (08/02 1245) Resp:  [18] 18 (08/02 1245) BP: (148)/(95) 148/95 (08/02 1245) SpO2:  [96 %] 96 % (08/02 1245) Weight:  [77.6 kg] 77.6 kg (08/02 1245)  General Appearance: Alert, cooperative, no distress, appears stated age Head: Normocephalic, without obvious abnormality, atraumatic Eyes: PERRL, conjunctiva/corneas clear, EOM's intact    Neck: Supple, symmetrical, trachea midline Back: Symmetric, no curvature, ROM normal, no CVA tenderness Lungs:  respirations unlabored Heart: Regular rate and rhythm Abdomen: Soft, non-tender Extremities: Extremities normal, atraumatic, no cyanosis or edema Pulses: 2+ and symmetric all extremities Skin: Skin color, texture, turgor normal, no rashes or lesions  NEUROLOGIC:   Mental status: Alert and oriented x4,  no aphasia, good attention span, fund of knowledge, and memory Motor Exam - grossly normal Sensory Exam - grossly normal Reflexes: 1= Coordination - grossly normal Gait - grossly normal Balance - grossly normal Cranial Nerves: I: smell Not tested  II: visual acuity   OS: nl    OD: nl  II: visual fields Full to confrontation  II: pupils Equal, round, reactive to light  III,VII: ptosis None  III,IV,VI: extraocular muscles  Full ROM  V: mastication Normal  V: facial light touch sensation  Normal  V,VII: corneal reflex  Present  VII: facial muscle function - upper  Normal  VII: facial muscle function - lower Normal  VIII: hearing Not tested  IX: soft palate elevation  Normal  IX,X: gag reflex Present  XI: trapezius strength  5/5  XI: sternocleidomastoid strength 5/5  XI: neck flexion strength  5/5  XII: tongue strength  Normal    Data Review Lab Results  Component Value Date   WBC 9.0 09/29/2022   HGB 13.3 09/29/2022   HCT 41.5 09/29/2022   MCV 86.6 09/29/2022   PLT 300 09/29/2022   Lab  Results  Component Value Date   NA 136 09/29/2022   K 3.3 (L) 09/29/2022   CL 101 09/29/2022   CO2 23 09/29/2022   BUN 17 09/29/2022   CREATININE 1.12 (H) 09/29/2022   GLUCOSE 96 09/29/2022   Lab Results  Component Value Date   INR 0.9 09/29/2022    Assessment/Plan:  Estimated body mass index is 26.78 kg/m as calculated from the following:   Height as of this encounter: 5\' 7"  (1.702 m).   Weight as of this encounter: 77.6 kg. Patient admitted for PIF L2-3. Patient has failed a reasonable attempt at conservative therapy.  I explained the condition and procedure to the patient and answered any questions.  Patient wishes to proceed with procedure as planned. Understands risks/ benefits and typical outcomes of procedure.   Tia Alert 10/03/2022 2:03 PM

## 2022-10-03 NOTE — Transfer of Care (Signed)
Immediate Anesthesia Transfer of Care Note  Patient: Shelia [name]  Procedure(s) Performed: POSTERIOR LUMBAR INTERBODY FUSION LUMBAR TWO-THREE, REMOVAL OF  HARDWARE LUMBAR THREE-FIVE (Back)  Patient Location: PACU  Anesthesia Type:General  Level of Consciousness: awake, alert , and oriented  Airway & Oxygen Therapy: Patient Spontanous Breathing and Patient connected to face mask oxygen  Post-op Assessment: Report given to RN, Post -op Vital signs reviewed and stable, Patient moving all extremities X 4, and Patient able to stick tongue midline  Post vital signs: Reviewed and stable  Last Vitals:  Vitals Value Taken Time  BP 139/71 10/03/22 1727  Temp 36.7 C 10/03/22 1727  Pulse 79 10/03/22 1728  Resp 13 10/03/22 1728  SpO2 100 % 10/03/22 1728  Vitals shown include unfiled device data.  Last Pain:  Vitals:   10/03/22 1306  TempSrc:   PainSc: 0-No pain         Complications: No notable events documented.

## 2022-10-03 NOTE — Anesthesia Procedure Notes (Addendum)
Procedure Name: Intubation Date/Time: 10/03/2022 2:49 PM  Performed by: Cy Blamer, CRNAPre-anesthesia Checklist: Patient identified, Emergency Drugs available, Suction available and Patient being monitored Patient Re-evaluated:Patient Re-evaluated prior to induction Oxygen Delivery Method: Circle system utilized Preoxygenation: Pre-oxygenation with 100% oxygen Induction Type: IV induction Ventilation: Mask ventilation without difficulty Laryngoscope Size: Miller and 2 Grade View: Grade I Tube type: Oral Tube size: 7.0 mm Number of attempts: 1 Airway Equipment and Method: Stylet and Bite block Placement Confirmation: ETT inserted through vocal cords under direct vision, positive ETCO2 and breath sounds checked- equal and bilateral Secured at: 21 cm Tube secured with: Tape Dental Injury: Teeth and Oropharynx as per pre-operative assessment  Comments: Limited oral opening

## 2022-10-04 MED ORDER — METHOCARBAMOL 500 MG PO TABS: 500.0000 mg | ORAL_TABLET | Freq: Four times a day (QID) | ORAL | 1 refills | Status: DC | PRN

## 2022-10-04 MED ORDER — OXYCODONE HCL 5 MG PO TABS: 5.0000 mg | ORAL_TABLET | ORAL | 0 refills | Status: DC | PRN

## 2022-10-04 NOTE — Progress Notes (Signed)
Patient alert and oriented, voiding adequately, skin clean, dry and intact without evidence of skin break down, or symptoms of complications - no redness or edema noted, only slight tenderness at site.  Patient states pain is manageable at time of discharge. Patient has an appointment with MD in 2 weeks 

## 2022-10-04 NOTE — Evaluation (Signed)
Physical Therapy Evaluation Patient Details Name: Shelia [name] MRN: [id number] DOB: 1945/09/01 Today's Date: 10/04/2022  History of Present Illness  Pt is a 77 yr old female who presented due to back and LLE pain with symptoms increasing. 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 Pt s/p lumbar laminectomy L2-3 with PLIF L2-3. PMH: AAA, anxiety, DJD, GERD, HTN, IBS, multiple back sxs, hiatal hernia  Clinical Impression  Pt presents to PT with deficits in functional mobility, gait, balance, power, endurance. Pt is able to ambulate for household distances with support of RW, pt negotiates stairs with support of railings. Pt is encouraged to mobilize frequently in an effort to improve activity tolerance and restore independence.      If plan is discharge home, recommend the following: A little help with bathing/dressing/bathroom;Assistance with cooking/housework;Assist for transportation;Help with stairs or ramp for entrance   Can travel by private vehicle        Equipment Recommendations None recommended by PT  Recommendations for Other Services       Functional Status Assessment Patient has had a recent decline in their functional status and demonstrates the ability to make significant improvements in function in a reasonable and predictable amount of time.     Precautions / Restrictions Precautions Precautions: Back Precaution Booklet Issued: Yes (comment) Precaution Comments: verbally reviewed in session Required Braces or Orthoses: Spinal Brace Spinal Brace: Lumbar corset Restrictions Weight Bearing Restrictions: No      Mobility  Bed Mobility Overal bed mobility: Needs Assistance Bed Mobility: Rolling, Sidelying to Sit, Sit to Sidelying Rolling: Supervision Sidelying to sit: Supervision     Sit to sidelying: Supervision      Transfers Overall transfer level: Needs assistance Equipment used: Rolling walker (2 wheels) Transfers: Sit to/from  Stand Sit to Stand: Supervision                Ambulation/Gait Ambulation/Gait assistance: Supervision Gait Distance (Feet): 150 Feet (150 x 2) Assistive device: Rolling walker (2 wheels) Gait Pattern/deviations: Step-through pattern Gait velocity: functional Gait velocity interpretation: 1.31 - 2.62 ft/sec, indicative of limited community ambulator   General Gait Details: slowed step-through gait  Stairs Stairs: Yes Stairs assistance: Supervision Stair Management: Two rails, Step to pattern Number of Stairs: 3    Wheelchair Mobility     Tilt Bed    Modified Rankin (Stroke Patients Only)       Balance Overall balance assessment: Needs assistance Sitting-balance support: No upper extremity supported, Feet supported Sitting balance-Leahy Scale: Good     Standing balance support: Single extremity supported, Reliant on assistive device for balance Standing balance-Leahy Scale: Poor                               Pertinent Vitals/Pain Pain Assessment Pain Assessment: Faces Faces Pain Scale: Hurts little more Pain Location: sx site Pain Descriptors / Indicators: Aching, Grimacing, Guarding Pain Intervention(s): Limited activity within patient's tolerance    Home Living Family/patient expects to be discharged to:: Private residence Living Arrangements: Spouse/significant other Available Help at Discharge: Family Type of Home: House Home Access: Stairs to enter Entrance Stairs-Rails: Lawyer of Steps: 3 plus landing   Home Layout: One level Home Equipment: Agricultural consultant (2 wheels);Shower seat;BSC/3in1;Grab bars - tub/shower;Adaptive equipment      Prior Function Prior Level of Function : Independent/Modified Independent             Mobility Comments: limited due  to pain and may use huband for some assit       Hand Dominance   Dominant Hand: Right    Extremity/Trunk Assessment   Upper Extremity  Assessment Upper Extremity Assessment: Overall WFL for tasks assessed    Lower Extremity Assessment Lower Extremity Assessment: Generalized weakness    Cervical / Trunk Assessment Cervical / Trunk Assessment: Back Surgery  Communication   Communication: No difficulties  Cognition Arousal/Alertness: Awake/alert Behavior During Therapy: WFL for tasks assessed/performed Overall Cognitive Status: Within Functional Limits for tasks assessed                                          General Comments General comments (skin integrity, edema, etc.): VSS on RA    Exercises     Assessment/Plan    PT Assessment Patient needs continued PT services  PT Problem List Decreased strength;Decreased activity tolerance;Decreased balance;Decreased mobility       PT Treatment Interventions DME instruction;Stair training;Gait training;Functional mobility training;Therapeutic activities;Therapeutic exercise;Neuromuscular re-education;Balance training;Patient/family education    PT Goals (Current goals can be found in the Care Plan section)  Acute Rehab PT Goals Patient Stated Goal: to go home PT Goal Formulation: With patient Time For Goal Achievement: 10/09/22 Potential to Achieve Goals: Good    Frequency Min 5X/week     Co-evaluation               AM-PAC PT "6 Clicks" Mobility  Outcome Measure Help needed turning from your back to your side while in a flat bed without using bedrails?: A Little Help needed moving from lying on your back to sitting on the side of a flat bed without using bedrails?: A Little Help needed moving to and from a bed to a chair (including a wheelchair)?: A Little Help needed standing up from a chair using your arms (e.g., wheelchair or bedside chair)?: A Little Help needed to walk in hospital room?: A Little Help needed climbing 3-5 steps with a railing? : A Little 6 Click Score: 18    End of Session Equipment Utilized During Treatment:  Back brace Activity Tolerance: Patient tolerated treatment well Patient left: in bed;with call bell/phone within reach Nurse Communication: Mobility status PT Visit Diagnosis: Other abnormalities of gait and mobility (R26.89);Pain    Time: 1610-9604 PT Time Calculation (min) (ACUTE ONLY): 28 min   Charges:   PT Evaluation $PT Eval Low Complexity: 1 Low   PT General Charges $$ ACUTE PT VISIT: 1 Visit         Arlyss Gandy, PT, DPT Acute Rehabilitation Office [phone]   Arlyss Gandy 10/04/2022, 10:06 AM

## 2022-10-04 NOTE — Evaluation (Signed)
Occupational Therapy Evaluation Patient Details Name: Shelia [name] MRN: [id number] DOB: January 19, 1946 Today's Date: 10/04/2022   History of Present Illness Pt is a 77 yr old female who presented due to back and LLE pain with symptoms increasing. 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 Pt s/p lumbar laminectomy L2-3 with PLIF L2-3. PMH: AAA, anxiety, DJD, GERD, HTN, IBS, multiple back sxs, hiatal hernia   Clinical Impression   Pt was in room with husband and daughter in session. Pt was educated about back precautions at this time and use of brace. Pt and husband was educated with long handle reacher on how to complete LB ADLS with supervision to min guard.  Pt at this time could only complete room level ambulation as started to feel "hot" and requesting to go back down into supine. Pt plans to have husband to assist for several days when returning to home with a friend to check. Plan to follow at the acute level at this time.       Recommendations for follow up therapy are one component of a multi-disciplinary discharge planning process, led by the attending physician.  Recommendations may be updated based on patient status, additional functional criteria and insurance authorization.   Assistance Recommended at Discharge Frequent or constant Supervision/Assistance  Patient can return home with the following A little help with walking and/or transfers;A little help with bathing/dressing/bathroom;Assistance with cooking/housework;Assist for transportation    Functional Status Assessment  Patient has had a recent decline in their functional status and demonstrates the ability to make significant improvements in function in a reasonable and predictable amount of time.  Equipment Recommendations  None recommended by OT    Recommendations for Other Services       Precautions / Restrictions Precautions Precautions: Back Precaution Comments: verbally reviewed in session Required  Braces or Orthoses: Spinal Brace Spinal Brace: Lumbar corset Restrictions Weight Bearing Restrictions: No      Mobility Bed Mobility Overal bed mobility: Needs Assistance Bed Mobility: Rolling, Supine to Sit, Sit to Supine Rolling: Min guard   Supine to sit: Min guard Sit to supine: Min guard   General bed mobility comments: with bed rail use    Transfers Overall transfer level: Needs assistance Equipment used: Rolling walker (2 wheels) Transfers: Sit to/from Stand Sit to Stand: Min guard                  Balance Overall balance assessment: Needs assistance Sitting-balance support: Feet supported Sitting balance-Leahy Scale: Good     Standing balance support: Single extremity supported, Reliant on assistive device for balance Standing balance-Leahy Scale: Fair                             ADL either performed or assessed with clinical judgement   ADL Overall ADL's : Needs assistance/impaired Eating/Feeding: Independent   Grooming: Wash/dry hands;Wash/dry face;Min guard   Upper Body Bathing: Supervision/ safety;Sitting   Lower Body Bathing: Min guard;Cueing for safety;Cueing for sequencing;Cueing for back precautions;With adaptive equipment   Upper Body Dressing : Min guard   Lower Body Dressing: Min guard;Cueing for safety;Cueing for sequencing;Cueing for compensatory techniques;With adaptive equipment   Toilet Transfer: Min guard   Toileting- Architect and Hygiene: Min guard   Tub/ Shower Transfer: Min guard;Cueing for safety;Cueing for sequencing   Functional mobility during ADLs: Min guard;Rolling walker (2 wheels)       Vision Baseline Vision/History: 1 Wears glasses Ability  to See in Adequate Light: 0 Adequate Patient Visual Report: No change from baseline Vision Assessment?: No apparent visual deficits     Perception Perception Perception Tested?: No   Praxis Praxis Praxis tested?: Within functional limits     Pertinent Vitals/Pain Pain Assessment Pain Assessment: Faces Faces Pain Scale: Hurts little more Pain Location: sx site Pain Descriptors / Indicators: Aching, Grimacing, Guarding Pain Intervention(s): Limited activity within patient's tolerance     Hand Dominance Right   Extremity/Trunk Assessment Upper Extremity Assessment Upper Extremity Assessment: Overall WFL for tasks assessed   Lower Extremity Assessment Lower Extremity Assessment: Defer to PT evaluation   Cervical / Trunk Assessment Cervical / Trunk Assessment: Back Surgery   Communication Communication Communication: No difficulties   Cognition Arousal/Alertness: Awake/alert Behavior During Therapy: WFL for tasks assessed/performed Overall Cognitive Status: Within Functional Limits for tasks assessed                                       General Comments       Exercises     Shoulder Instructions      Home Living Family/patient expects to be discharged to:: Private residence Living Arrangements: Spouse/significant other Available Help at Discharge: Family Type of Home: House Home Access: Stairs to enter [employer] of Steps: 3 plus landing Entrance Stairs-Rails: Left;Right Home Layout: One level     Bathroom Shower/Tub: Producer, television/film/video: Standard Bathroom Accessibility: Yes How Accessible: Accessible via walker Home Equipment: Agricultural consultant (2 wheels);Shower seat;BSC/3in1;Grab bars - tub/shower;Adaptive equipment Adaptive Equipment: Reacher;Sock aid;Long-handled sponge        Prior Functioning/Environment Prior Level of Function : Independent/Modified Independent             Mobility Comments: limited due to pain and may use huband for some assit          OT Problem List: Decreased strength;Decreased activity tolerance;Impaired balance (sitting and/or standing);Decreased safety awareness;Decreased knowledge of use of DME or AE;Pain      OT  Treatment/Interventions: Self-care/ADL training;Therapeutic exercise;DME and/or AE instruction;Patient/family education;Balance training    OT Goals(Current goals can be found in the care plan section) Acute Rehab OT Goals Patient Stated Goal: to get a little bit of rehab OT Goal Formulation: With patient Time For Goal Achievement: 10/18/22 Potential to Achieve Goals: Good ADL Goals Pt Will Perform Lower Body Bathing: with modified independence;sit to/from stand Pt Will Perform Lower Body Dressing: with modified independence;with adaptive equipment;sit to/from stand Pt Will Transfer to Toilet: with modified independence;ambulating Pt Will Perform Tub/Shower Transfer: Shower transfer;with modified independence;ambulating;shower seat  OT Frequency: Min 2X/week    Co-evaluation              AM-PAC OT "6 Clicks" Daily Activity     Outcome Measure Help from another person eating meals?: None Help from another person taking care of personal grooming?: None Help from another person toileting, which includes using toliet, bedpan, or urinal?: A Little Help from another person bathing (including washing, rinsing, drying)?: A Little Help from another person to put on and taking off regular upper body clothing?: A Little Help from another person to put on and taking off regular lower body clothing?: A Little 6 Click Score: 20   End of Session Equipment Utilized During Treatment: Gait belt;Rolling walker (2 wheels) Nurse Communication: Mobility status  Activity Tolerance: Patient limited by fatigue Patient left: in bed;with call bell/phone  within reach;with family/visitor present  OT Visit Diagnosis: Unsteadiness on feet (R26.81);Other abnormalities of gait and mobility (R26.89);Muscle weakness (generalized) (M62.81);Pain Pain - Right/Left:  (back)                Time: 1610-9604 OT Time Calculation (min): 43 min Charges:  OT General Charges $OT Visit: 1 Visit OT Evaluation $OT Eval  Low Complexity: 1 Low OT Treatments $Self Care/Home Management : 23-37 mins  Presley Raddle OTR/L  Acute Rehab Services  [phone] office number   Alphia Moh 10/04/2022, 9:36 AM

## 2022-10-04 NOTE — TOC Transition Note (Signed)
Transition of Care [facility]) - CM/SW Discharge Note   Patient Details  Name: Shelia [name] MRN: [id number] Date of Birth: 1945-09-19  Transition of Care [facility]) CM/SW Contact:  Lawerance Sabal, RN Phone Number: 10/04/2022, 10:16 AM   Clinical Narrative:     Spoke to patient and visitor at bedside. Made arrangements for [facility] care.  [facility] accepted  Unit staff to supply DME needs  Final next level of care: Home w Home Health Services Barriers to Discharge: No Barriers Identified   Patient Goals and CMS Choice      Discharge Placement                         Discharge Plan and Services Additional resources added to the After Visit Summary for                              [facility] Agency: [facility] Date [facility] Agency Contacted: 10/04/22 Time HH Agency Contacted: 1015 Representative spoke with at [facility] Agency: Angie  Social Determinants of Health (SDOH) Interventions SDOH Screenings   Tobacco Use: Low Risk  (10/03/2022)     Readmission Risk Interventions     No data to display

## 2022-10-04 NOTE — Discharge Instructions (Signed)
\  Wound Care Keep incision covered and dry until post op day 3. You may remove the Honeycomb dressing on post op day 3. Leave steri-strips on back.  They will fall off by themselves. Do not put any creams, lotions, or ointments on incision. You are fine to shower. Let water run over incision and pat dry.  Activity Activity Walk each and every day, increasing distance each day. No lifting greater than 8 lbs.  No lifting no bending no twisting no driving, you can ride as a passenger If provided with back brace, wear when out of bed.  It is not necessary to wear brace in bed.  Diet Resume your normal diet.    Call Your Doctor If Any of These Occur Redness, drainage, or swelling at the wound.  Temperature greater than 101 degrees. Severe pain not relieved by pain medication. Incision starts to come apart.  Follow Up Appt Call [phone] if you have one or any problem.

## 2022-10-05 NOTE — Discharge Summary (Signed)
Physician Discharge Summary  Patient ID: Shelia [name] MRN: [id number] DOB/AGE: 08-Feb-1946 77 y.o.  Admit date: 10/03/2022 Discharge date: 10/05/2022  Admission Diagnoses:  Discharge Diagnoses:  Principal Problem:   S/P lumbar fusion   Discharged Condition: good  Hospital Course: Patient admitted to the hospital where she underwent uncomplicated lumbar decompression fusion surgery.  Postoperatively doing well.  Back pain well-controlled.  No significant lower extremity pain.  Standing ambulating and voiding without difficulty.  Ready for discharge home. Consults:   Significant Diagnostic Studies:   Treatments:   Discharge Exam: Blood pressure 106/64, pulse 71, temperature 97.8 F (36.6 C), temperature source Oral, resp. rate 19, height 5\' 7"  (1.702 m), weight 77.6 kg, last menstrual period 08/02/1979, SpO2 98%. Awake and alert.  Oriented and appropriate.  Motor and sensory function intact.  Wound clean and dry.  Chest and abdomen benign.  Disposition: Discharge disposition: 01-Home or Self Care       Discharge Instructions     Face-to-face encounter (required for Medicare/Medicaid patients)   Complete by: As directed    I Temple Pacini certify that this patient is under my care and that I, or a nurse practitioner or physician's assistant working with me, had a face-to-face encounter that meets the physician face-to-face encounter requirements with th is patient on 10/04/2022. The encounter with the patient was in whole, or in part for the following medical condition(s) which is the primary reason for home health care (List medical condition): Status post lumbar fusion   The encounter with the patient was in whole, or in part, for the following medical condition, which is the primary reason for home health care: Status post lumbar fusion   I certify that, based on my findings, the following services are medically necessary home health services: Physical therapy   Reason for  Medically Necessary Home Health Services: Therapy- Investment banker, operational, Patent examiner   My clinical findings support the need for the above services: Unable to leave home safely without assistance and/or assistive device   Further, I certify that my clinical findings support that this patient is homebound due to: Pain interferes with ambulation/mobility   Home Health   Complete by: As directed    To provide the following care/treatments:  PT OT        Allergies as of 10/04/2022   No Known Allergies      Medication List     TAKE these medications    acetaminophen 650 MG CR tablet Commonly known as: TYLENOL Take 650 mg by mouth every 8 (eight) hours as needed for pain.   amLODipine 5 MG tablet Commonly known as: NORVASC Take 5 mg by mouth daily.   aspirin EC 81 MG tablet Take 81 mg by mouth daily.   Biotin 5000 MCG Tabs Take 5,000 mcg by mouth daily.   CALCIUM 1200+D3 PO Take 1 tablet by mouth daily.   cetirizine 10 MG tablet Commonly known as: ZYRTEC Take 10 mg by mouth daily.   cholecalciferol 1000 units tablet Commonly known as: VITAMIN D Take 1,000 Units by mouth daily.   fish oil-omega-3 fatty acids 1000 MG capsule Take 1 g by mouth daily.   fluticasone 50 MCG/ACT nasal spray Commonly known as: FLONASE Place 2 sprays into both nostrils daily as needed for allergies or rhinitis.   gabapentin 300 MG capsule Commonly known as: NEURONTIN Take 300 mg by mouth 3 (three) times daily.   hydrochlorothiazide 25 MG tablet Commonly known as: HYDRODIURIL Take  12.5 mg by mouth daily. Dose varies based on fluid retention/blood pressure.   HYDROcodone-acetaminophen 5-325 MG tablet Commonly known as: NORCO/VICODIN Take 1 tablet by mouth 3 (three) times daily as needed for severe pain.   methocarbamol 500 MG tablet Commonly known as: ROBAXIN Take 1 tablet (500 mg total) by mouth every 6 (six) hours as needed for muscle spasms.   multivitamin with  minerals tablet Take 1 tablet by mouth daily.   oxyCODONE 5 MG immediate release tablet Commonly known as: Oxy IR/ROXICODONE Take 1 tablet (5 mg total) by mouth every 3 (three) hours as needed for moderate pain ((score 4 to 6)).   pantoprazole 40 MG tablet Commonly known as: PROTONIX TAKE 1 TABLET EVERY DAY   rosuvastatin 5 MG tablet Commonly known as: CRESTOR Take 5 mg by mouth daily.   telmisartan 80 MG tablet Commonly known as: MICARDIS Take 80 mg by mouth daily.   tolterodine 4 MG 24 hr capsule Commonly known as: DETROL LA Take 4 mg by mouth daily.   zolpidem 10 MG tablet Commonly known as: AMBIEN Take 10 mg by mouth at bedtime.        Follow-up Information     [facility] Follow up.   Specialty: Home Health Services Why: also known as [facility] they will call you to schedule a time to come out to your house Contact information: [address] [place] [address] [phone]         Arman Bogus, MD. Call.   Specialty: Neurosurgery Why: As needed, If symptoms worsen Contact information: [address] [place] [address] [phone]                 Signed: Temple Pacini 10/05/2022, 9:07 AM

## 2022-10-06 DIAGNOSIS — F32A Depression, unspecified: Secondary | ICD-10-CM | POA: Diagnosis not present

## 2022-10-06 DIAGNOSIS — I10 Essential (primary) hypertension: Secondary | ICD-10-CM | POA: Diagnosis not present

## 2022-10-06 DIAGNOSIS — Z981 Arthrodesis status: Secondary | ICD-10-CM | POA: Diagnosis not present

## 2022-10-06 DIAGNOSIS — Z7982 Long term (current) use of aspirin: Secondary | ICD-10-CM | POA: Diagnosis not present

## 2022-10-06 DIAGNOSIS — Z4789 Encounter for other orthopedic aftercare: Secondary | ICD-10-CM | POA: Diagnosis not present

## 2022-10-06 DIAGNOSIS — F419 Anxiety disorder, unspecified: Secondary | ICD-10-CM | POA: Diagnosis not present

## 2022-10-06 DIAGNOSIS — I7121 Aneurysm of the ascending aorta, without rupture: Secondary | ICD-10-CM | POA: Diagnosis not present

## 2022-10-06 DIAGNOSIS — I739 Peripheral vascular disease, unspecified: Secondary | ICD-10-CM | POA: Diagnosis not present

## 2022-10-06 NOTE — Anesthesia Postprocedure Evaluation (Signed)
Anesthesia Post Note  Patient: Shelia [name]  Procedure(s) Performed: POSTERIOR LUMBAR INTERBODY FUSION LUMBAR TWO-THREE, REMOVAL OF  HARDWARE LUMBAR THREE-FIVE (Back)     Patient location during evaluation: PACU Anesthesia Type: General Level of consciousness: awake and alert Pain management: pain level controlled Vital Signs Assessment: post-procedure vital signs reviewed and stable Respiratory status: spontaneous breathing, nonlabored ventilation, respiratory function stable and patient connected to nasal cannula oxygen Cardiovascular status: blood pressure returned to baseline and stable Postop Assessment: no apparent nausea or vomiting Anesthetic complications: no   No notable events documented.  Last Vitals:  Vitals:   10/04/22 0429 10/04/22 0717  BP: 126/74 106/64  Pulse: 70 71  Resp: 18 19  Temp: 36.6 C 36.6 C  SpO2: 98% 98%    Last Pain:  Vitals:   10/04/22 1045  TempSrc:   PainSc: 3                   

## 2022-10-08 DIAGNOSIS — F32A Depression, unspecified: Secondary | ICD-10-CM | POA: Diagnosis not present

## 2022-10-08 DIAGNOSIS — I7121 Aneurysm of the ascending aorta, without rupture: Secondary | ICD-10-CM | POA: Diagnosis not present

## 2022-10-08 DIAGNOSIS — Z4789 Encounter for other orthopedic aftercare: Secondary | ICD-10-CM | POA: Diagnosis not present

## 2022-10-08 DIAGNOSIS — Z981 Arthrodesis status: Secondary | ICD-10-CM | POA: Diagnosis not present

## 2022-10-08 DIAGNOSIS — I739 Peripheral vascular disease, unspecified: Secondary | ICD-10-CM | POA: Diagnosis not present

## 2022-10-08 DIAGNOSIS — I10 Essential (primary) hypertension: Secondary | ICD-10-CM | POA: Diagnosis not present

## 2022-10-17 DIAGNOSIS — F32A Depression, unspecified: Secondary | ICD-10-CM | POA: Diagnosis not present

## 2022-10-17 DIAGNOSIS — Z981 Arthrodesis status: Secondary | ICD-10-CM | POA: Diagnosis not present

## 2022-10-17 DIAGNOSIS — I10 Essential (primary) hypertension: Secondary | ICD-10-CM | POA: Diagnosis not present

## 2022-10-17 DIAGNOSIS — I739 Peripheral vascular disease, unspecified: Secondary | ICD-10-CM | POA: Diagnosis not present

## 2022-10-17 DIAGNOSIS — Z4789 Encounter for other orthopedic aftercare: Secondary | ICD-10-CM | POA: Diagnosis not present

## 2022-10-17 DIAGNOSIS — I7121 Aneurysm of the ascending aorta, without rupture: Secondary | ICD-10-CM | POA: Diagnosis not present

## 2022-10-21 DIAGNOSIS — I739 Peripheral vascular disease, unspecified: Secondary | ICD-10-CM | POA: Diagnosis not present

## 2022-10-21 DIAGNOSIS — Z4789 Encounter for other orthopedic aftercare: Secondary | ICD-10-CM | POA: Diagnosis not present

## 2022-10-21 DIAGNOSIS — F32A Depression, unspecified: Secondary | ICD-10-CM | POA: Diagnosis not present

## 2022-10-21 DIAGNOSIS — Z981 Arthrodesis status: Secondary | ICD-10-CM | POA: Diagnosis not present

## 2022-10-21 DIAGNOSIS — I7121 Aneurysm of the ascending aorta, without rupture: Secondary | ICD-10-CM | POA: Diagnosis not present

## 2022-10-21 DIAGNOSIS — I10 Essential (primary) hypertension: Secondary | ICD-10-CM | POA: Diagnosis not present

## 2022-10-24 DIAGNOSIS — Z4789 Encounter for other orthopedic aftercare: Secondary | ICD-10-CM | POA: Diagnosis not present

## 2022-10-24 DIAGNOSIS — I739 Peripheral vascular disease, unspecified: Secondary | ICD-10-CM | POA: Diagnosis not present

## 2022-10-24 DIAGNOSIS — Z981 Arthrodesis status: Secondary | ICD-10-CM | POA: Diagnosis not present

## 2022-10-24 DIAGNOSIS — F32A Depression, unspecified: Secondary | ICD-10-CM | POA: Diagnosis not present

## 2022-10-24 DIAGNOSIS — I7121 Aneurysm of the ascending aorta, without rupture: Secondary | ICD-10-CM | POA: Diagnosis not present

## 2022-10-24 DIAGNOSIS — I10 Essential (primary) hypertension: Secondary | ICD-10-CM | POA: Diagnosis not present

## 2022-10-27 DIAGNOSIS — I7121 Aneurysm of the ascending aorta, without rupture: Secondary | ICD-10-CM | POA: Diagnosis not present

## 2022-10-27 DIAGNOSIS — I10 Essential (primary) hypertension: Secondary | ICD-10-CM | POA: Diagnosis not present

## 2022-10-27 DIAGNOSIS — Z981 Arthrodesis status: Secondary | ICD-10-CM | POA: Diagnosis not present

## 2022-10-27 DIAGNOSIS — Z4789 Encounter for other orthopedic aftercare: Secondary | ICD-10-CM | POA: Diagnosis not present

## 2022-10-27 DIAGNOSIS — I739 Peripheral vascular disease, unspecified: Secondary | ICD-10-CM | POA: Diagnosis not present

## 2022-10-27 DIAGNOSIS — F32A Depression, unspecified: Secondary | ICD-10-CM | POA: Diagnosis not present

## 2022-11-05 DIAGNOSIS — Z7982 Long term (current) use of aspirin: Secondary | ICD-10-CM | POA: Diagnosis not present

## 2022-11-05 DIAGNOSIS — I739 Peripheral vascular disease, unspecified: Secondary | ICD-10-CM | POA: Diagnosis not present

## 2022-11-05 DIAGNOSIS — Z981 Arthrodesis status: Secondary | ICD-10-CM | POA: Diagnosis not present

## 2022-11-05 DIAGNOSIS — Z4789 Encounter for other orthopedic aftercare: Secondary | ICD-10-CM | POA: Diagnosis not present

## 2022-11-05 DIAGNOSIS — F32A Depression, unspecified: Secondary | ICD-10-CM | POA: Diagnosis not present

## 2022-11-05 DIAGNOSIS — I10 Essential (primary) hypertension: Secondary | ICD-10-CM | POA: Diagnosis not present

## 2022-11-05 DIAGNOSIS — F419 Anxiety disorder, unspecified: Secondary | ICD-10-CM | POA: Diagnosis not present

## 2022-11-05 DIAGNOSIS — I7121 Aneurysm of the ascending aorta, without rupture: Secondary | ICD-10-CM | POA: Diagnosis not present

## 2022-11-10 DIAGNOSIS — I10 Essential (primary) hypertension: Secondary | ICD-10-CM | POA: Diagnosis not present

## 2022-11-10 DIAGNOSIS — Z4789 Encounter for other orthopedic aftercare: Secondary | ICD-10-CM | POA: Diagnosis not present

## 2022-11-10 DIAGNOSIS — F32A Depression, unspecified: Secondary | ICD-10-CM | POA: Diagnosis not present

## 2022-11-10 DIAGNOSIS — I7121 Aneurysm of the ascending aorta, without rupture: Secondary | ICD-10-CM | POA: Diagnosis not present

## 2022-11-10 DIAGNOSIS — I739 Peripheral vascular disease, unspecified: Secondary | ICD-10-CM | POA: Diagnosis not present

## 2022-11-10 DIAGNOSIS — Z981 Arthrodesis status: Secondary | ICD-10-CM | POA: Diagnosis not present

## 2022-11-13 DIAGNOSIS — M5416 Radiculopathy, lumbar region: Secondary | ICD-10-CM | POA: Diagnosis not present

## 2022-11-13 DIAGNOSIS — Z6826 Body mass index (BMI) 26.0-26.9, adult: Secondary | ICD-10-CM | POA: Diagnosis not present

## 2022-11-22 ENCOUNTER — Other Ambulatory Visit: Payer: Self-pay | Admitting: Primary Care

## 2022-12-01 DIAGNOSIS — Z981 Arthrodesis status: Secondary | ICD-10-CM | POA: Diagnosis not present

## 2022-12-01 DIAGNOSIS — I7121 Aneurysm of the ascending aorta, without rupture: Secondary | ICD-10-CM | POA: Diagnosis not present

## 2022-12-01 DIAGNOSIS — I10 Essential (primary) hypertension: Secondary | ICD-10-CM | POA: Diagnosis not present

## 2022-12-01 DIAGNOSIS — F32A Depression, unspecified: Secondary | ICD-10-CM | POA: Diagnosis not present

## 2022-12-01 DIAGNOSIS — I739 Peripheral vascular disease, unspecified: Secondary | ICD-10-CM | POA: Diagnosis not present

## 2022-12-01 DIAGNOSIS — Z4789 Encounter for other orthopedic aftercare: Secondary | ICD-10-CM | POA: Diagnosis not present

## 2022-12-27 DIAGNOSIS — Z23 Encounter for immunization: Secondary | ICD-10-CM | POA: Diagnosis not present

## 2022-12-29 DIAGNOSIS — M5451 Vertebrogenic low back pain: Secondary | ICD-10-CM | POA: Diagnosis not present

## 2022-12-29 DIAGNOSIS — M21372 Foot drop, left foot: Secondary | ICD-10-CM | POA: Diagnosis not present

## 2022-12-31 DIAGNOSIS — M5451 Vertebrogenic low back pain: Secondary | ICD-10-CM | POA: Diagnosis not present

## 2022-12-31 DIAGNOSIS — M21372 Foot drop, left foot: Secondary | ICD-10-CM | POA: Diagnosis not present

## 2023-01-03 DIAGNOSIS — Z23 Encounter for immunization: Secondary | ICD-10-CM | POA: Diagnosis not present

## 2023-01-09 DIAGNOSIS — M21372 Foot drop, left foot: Secondary | ICD-10-CM | POA: Diagnosis not present

## 2023-01-09 DIAGNOSIS — M5451 Vertebrogenic low back pain: Secondary | ICD-10-CM | POA: Diagnosis not present

## 2023-01-12 DIAGNOSIS — M5451 Vertebrogenic low back pain: Secondary | ICD-10-CM | POA: Diagnosis not present

## 2023-01-12 DIAGNOSIS — M21372 Foot drop, left foot: Secondary | ICD-10-CM | POA: Diagnosis not present

## 2023-01-19 DIAGNOSIS — M21372 Foot drop, left foot: Secondary | ICD-10-CM | POA: Diagnosis not present

## 2023-01-19 DIAGNOSIS — M5451 Vertebrogenic low back pain: Secondary | ICD-10-CM | POA: Diagnosis not present

## 2023-02-02 DIAGNOSIS — M5451 Vertebrogenic low back pain: Secondary | ICD-10-CM | POA: Diagnosis not present

## 2023-02-02 DIAGNOSIS — M21372 Foot drop, left foot: Secondary | ICD-10-CM | POA: Diagnosis not present

## 2023-02-05 DIAGNOSIS — M21372 Foot drop, left foot: Secondary | ICD-10-CM | POA: Diagnosis not present

## 2023-02-05 DIAGNOSIS — M5451 Vertebrogenic low back pain: Secondary | ICD-10-CM | POA: Diagnosis not present

## 2023-02-10 DIAGNOSIS — M7061 Trochanteric bursitis, right hip: Secondary | ICD-10-CM | POA: Diagnosis not present

## 2023-02-10 DIAGNOSIS — Z6825 Body mass index (BMI) 25.0-25.9, adult: Secondary | ICD-10-CM | POA: Diagnosis not present

## 2023-02-10 DIAGNOSIS — M5416 Radiculopathy, lumbar region: Secondary | ICD-10-CM | POA: Diagnosis not present

## 2023-02-12 DIAGNOSIS — Z1231 Encounter for screening mammogram for malignant neoplasm of breast: Secondary | ICD-10-CM | POA: Diagnosis not present

## 2023-02-13 DIAGNOSIS — M21372 Foot drop, left foot: Secondary | ICD-10-CM | POA: Diagnosis not present

## 2023-02-13 DIAGNOSIS — M5451 Vertebrogenic low back pain: Secondary | ICD-10-CM | POA: Diagnosis not present

## 2023-02-20 DIAGNOSIS — M21372 Foot drop, left foot: Secondary | ICD-10-CM | POA: Diagnosis not present

## 2023-02-20 DIAGNOSIS — M5451 Vertebrogenic low back pain: Secondary | ICD-10-CM | POA: Diagnosis not present

## 2023-02-27 DIAGNOSIS — M5451 Vertebrogenic low back pain: Secondary | ICD-10-CM | POA: Diagnosis not present

## 2023-02-27 DIAGNOSIS — M21372 Foot drop, left foot: Secondary | ICD-10-CM | POA: Diagnosis not present

## 2023-03-02 DIAGNOSIS — M21372 Foot drop, left foot: Secondary | ICD-10-CM | POA: Diagnosis not present

## 2023-03-02 DIAGNOSIS — M5451 Vertebrogenic low back pain: Secondary | ICD-10-CM | POA: Diagnosis not present

## 2023-03-11 DIAGNOSIS — M21372 Foot drop, left foot: Secondary | ICD-10-CM | POA: Diagnosis not present

## 2023-03-11 DIAGNOSIS — M5451 Vertebrogenic low back pain: Secondary | ICD-10-CM | POA: Diagnosis not present

## 2023-03-16 DIAGNOSIS — M21372 Foot drop, left foot: Secondary | ICD-10-CM | POA: Diagnosis not present

## 2023-03-16 DIAGNOSIS — M5451 Vertebrogenic low back pain: Secondary | ICD-10-CM | POA: Diagnosis not present

## 2023-03-20 DIAGNOSIS — M5451 Vertebrogenic low back pain: Secondary | ICD-10-CM | POA: Diagnosis not present

## 2023-03-20 DIAGNOSIS — M21372 Foot drop, left foot: Secondary | ICD-10-CM | POA: Diagnosis not present

## 2023-03-26 DIAGNOSIS — M5451 Vertebrogenic low back pain: Secondary | ICD-10-CM | POA: Diagnosis not present

## 2023-03-26 DIAGNOSIS — M21372 Foot drop, left foot: Secondary | ICD-10-CM | POA: Diagnosis not present

## 2023-03-31 DIAGNOSIS — M5451 Vertebrogenic low back pain: Secondary | ICD-10-CM | POA: Diagnosis not present

## 2023-03-31 DIAGNOSIS — M21372 Foot drop, left foot: Secondary | ICD-10-CM | POA: Diagnosis not present

## 2023-04-01 ENCOUNTER — Encounter (HOSPITAL_BASED_OUTPATIENT_CLINIC_OR_DEPARTMENT_OTHER): Payer: Self-pay | Admitting: Pulmonary Disease

## 2023-04-01 ENCOUNTER — Ambulatory Visit (HOSPITAL_BASED_OUTPATIENT_CLINIC_OR_DEPARTMENT_OTHER): Payer: Medicare Other | Admitting: Pulmonary Disease

## 2023-04-01 VITALS — BP 132/68 | HR 89 | Resp 16 | Ht 67.0 in | Wt 159.4 lb

## 2023-04-01 DIAGNOSIS — R9389 Abnormal findings on diagnostic imaging of other specified body structures: Secondary | ICD-10-CM | POA: Diagnosis not present

## 2023-04-01 DIAGNOSIS — J189 Pneumonia, unspecified organism: Secondary | ICD-10-CM | POA: Diagnosis not present

## 2023-04-01 DIAGNOSIS — K219 Gastro-esophageal reflux disease without esophagitis: Secondary | ICD-10-CM | POA: Diagnosis not present

## 2023-04-01 DIAGNOSIS — I7121 Aneurysm of the ascending aorta, without rupture: Secondary | ICD-10-CM

## 2023-04-01 NOTE — Progress Notes (Signed)
Subjective:    Patient ID: Shelia [name], female    DOB: Apr 18, 1945, 78 y.o.   MRN: [id number]  HPI  78  yo  never smoker  for FU of asc TAA & reflux/aspiration episode  abnormal CT scan and mild hypoxia noted in PCP office    Infiltrates due to reflux aspiration, nodule stable since 2018 and considered benign.  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 She was recovering from a sinus infection and also has severe reflux.  Discussed the use of AI scribe software for clinical note transcription with the patient, who gave verbal consent to proceed.  History of Present Illness   The patient, with a history of aspiration pneumonia secondary to reflux and an ascending aortic aneurysm, presents for a routine follow-up. She reports no recurrence of pneumonia since the last episode in 2019. The reflux is well controlled with Protonix and lifestyle modifications, including dietary changes and remaining upright after meals. She notes that she still experiences reflux but it is manageable with the current regimen.  She underwent back surgery in August, performed by Dr. Marikay Alar, and reports a good recovery with no post-operative respiratory issues. She also notes that her breathing is generally good, with less difficulty in the winter compared to the summer months. She has not required any inhalers.  The patient also has a hiatal hernia, which is managed conservatively with lifestyle modifications and medication. She has not required surgical intervention.  The ascending aortic aneurysm has been stable with no progression noted on serial scans, the last of which was in January 2024.      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    CT angiogram from 01/2017 >> bibasilar atelectasis, small hiatal hernia and mildly prominent subcarinal lymph node No evidence of ILD on either scans  Review of Systems neg for any significant sore throat, dysphagia, itching, sneezing, nasal congestion or excess/ purulent secretions, fever, chills, sweats, unintended wt loss, pleuritic or exertional cp, hempoptysis, orthopnea pnd or change in chronic leg swelling. Also denies presyncope, palpitations, heartburn, abdominal pain, nausea, vomiting, diarrhea or change in bowel or urinary habits, dysuria,hematuria, rash, arthralgias, visual complaints, headache, numbness weakness or ataxia.     Objective:   Physical Exam  Gen. Pleasant, well-nourished, elderly,in no distress ENT - no thrush, no pallor/icterus,no post nasal drip Neck: No JVD, no thyromegaly, no carotid bruits Lungs: no use of accessory muscles, no dullness to percussion, clear without rales or rhonchi  Cardiovascular: Rhythm regular, heart sounds  normal, no murmurs or gallops, no peripheral edema Musculoskeletal: No deformities, no cyanosis or clubbing        Assessment & Plan:    Assessment and Plan    Aspiration Pneumonia Aspiration pneumonia secondary to reflux, last episode in 2019. Managed with GERD treatment, no recurrence since. - Continue current GERD management to prevent recurrence  Gastroesophageal Reflux Disease (GERD) Chronic GERD managed with Protonix and lifestyle  modifications. Symptoms well-controlled. Emphasis on remaining upright after meals. Surgery discussed as an option if symptoms become severe and unmanageable. - Continue Protonix as  needed, - Call for Protonix prescription as needed. - Maintain dietary modifications and upright posture after meals  Ascending Aortic Aneurysm Ascending aortic aneurysm with no progression on serial scans. Last scan in January 2024 showed no new findings. Biennial monitoring planned. - Schedule next scan for January 2026 - Continue biennial monitoring  General Health Maintenance Vaccinations up to date, including flu shot. Regular follow-ups with primary care and specialists. - Continue regular follow-ups with Dr. Felipa Eth - Ensure vaccinations remain up to date

## 2023-04-01 NOTE — Patient Instructions (Signed)
CT scan in jan 2026

## 2023-04-07 DIAGNOSIS — M21372 Foot drop, left foot: Secondary | ICD-10-CM | POA: Diagnosis not present

## 2023-04-07 DIAGNOSIS — M5451 Vertebrogenic low back pain: Secondary | ICD-10-CM | POA: Diagnosis not present

## 2023-04-13 DIAGNOSIS — M5451 Vertebrogenic low back pain: Secondary | ICD-10-CM | POA: Diagnosis not present

## 2023-04-13 DIAGNOSIS — M21372 Foot drop, left foot: Secondary | ICD-10-CM | POA: Diagnosis not present

## 2023-04-14 DIAGNOSIS — E785 Hyperlipidemia, unspecified: Secondary | ICD-10-CM | POA: Diagnosis not present

## 2023-04-14 DIAGNOSIS — I129 Hypertensive chronic kidney disease with stage 1 through stage 4 chronic kidney disease, or unspecified chronic kidney disease: Secondary | ICD-10-CM | POA: Diagnosis not present

## 2023-04-14 DIAGNOSIS — M810 Age-related osteoporosis without current pathological fracture: Secondary | ICD-10-CM | POA: Diagnosis not present

## 2023-04-14 DIAGNOSIS — N1832 Chronic kidney disease, stage 3b: Secondary | ICD-10-CM | POA: Diagnosis not present

## 2023-04-14 DIAGNOSIS — Z1212 Encounter for screening for malignant neoplasm of rectum: Secondary | ICD-10-CM | POA: Diagnosis not present

## 2023-04-17 DIAGNOSIS — M5451 Vertebrogenic low back pain: Secondary | ICD-10-CM | POA: Diagnosis not present

## 2023-04-17 DIAGNOSIS — M21372 Foot drop, left foot: Secondary | ICD-10-CM | POA: Diagnosis not present

## 2023-04-20 DIAGNOSIS — M21372 Foot drop, left foot: Secondary | ICD-10-CM | POA: Diagnosis not present

## 2023-04-20 DIAGNOSIS — M5451 Vertebrogenic low back pain: Secondary | ICD-10-CM | POA: Diagnosis not present

## 2023-04-21 DIAGNOSIS — N1832 Chronic kidney disease, stage 3b: Secondary | ICD-10-CM | POA: Diagnosis not present

## 2023-04-21 DIAGNOSIS — I129 Hypertensive chronic kidney disease with stage 1 through stage 4 chronic kidney disease, or unspecified chronic kidney disease: Secondary | ICD-10-CM | POA: Diagnosis not present

## 2023-04-21 DIAGNOSIS — E785 Hyperlipidemia, unspecified: Secondary | ICD-10-CM | POA: Diagnosis not present

## 2023-04-21 DIAGNOSIS — M810 Age-related osteoporosis without current pathological fracture: Secondary | ICD-10-CM | POA: Diagnosis not present

## 2023-04-21 DIAGNOSIS — K219 Gastro-esophageal reflux disease without esophagitis: Secondary | ICD-10-CM | POA: Diagnosis not present

## 2023-04-21 DIAGNOSIS — M48061 Spinal stenosis, lumbar region without neurogenic claudication: Secondary | ICD-10-CM | POA: Diagnosis not present

## 2023-04-21 DIAGNOSIS — M1990 Unspecified osteoarthritis, unspecified site: Secondary | ICD-10-CM | POA: Diagnosis not present

## 2023-04-21 DIAGNOSIS — N3010 Interstitial cystitis (chronic) without hematuria: Secondary | ICD-10-CM | POA: Diagnosis not present

## 2023-04-21 DIAGNOSIS — Z0000 Encounter for general adult medical examination without abnormal findings: Secondary | ICD-10-CM | POA: Diagnosis not present

## 2023-04-21 DIAGNOSIS — I10 Essential (primary) hypertension: Secondary | ICD-10-CM | POA: Diagnosis not present

## 2023-04-21 DIAGNOSIS — Z1211 Encounter for screening for malignant neoplasm of colon: Secondary | ICD-10-CM | POA: Diagnosis not present

## 2023-04-21 DIAGNOSIS — Z1212 Encounter for screening for malignant neoplasm of rectum: Secondary | ICD-10-CM | POA: Diagnosis not present

## 2023-04-21 DIAGNOSIS — I493 Ventricular premature depolarization: Secondary | ICD-10-CM | POA: Diagnosis not present

## 2023-04-21 DIAGNOSIS — I712 Thoracic aortic aneurysm, without rupture, unspecified: Secondary | ICD-10-CM | POA: Diagnosis not present

## 2023-04-27 DIAGNOSIS — M21372 Foot drop, left foot: Secondary | ICD-10-CM | POA: Diagnosis not present

## 2023-04-27 DIAGNOSIS — M5451 Vertebrogenic low back pain: Secondary | ICD-10-CM | POA: Diagnosis not present

## 2023-05-01 DIAGNOSIS — M5451 Vertebrogenic low back pain: Secondary | ICD-10-CM | POA: Diagnosis not present

## 2023-05-01 DIAGNOSIS — M21372 Foot drop, left foot: Secondary | ICD-10-CM | POA: Diagnosis not present

## 2023-05-04 DIAGNOSIS — Z1211 Encounter for screening for malignant neoplasm of colon: Secondary | ICD-10-CM | POA: Diagnosis not present

## 2023-05-05 DIAGNOSIS — H2513 Age-related nuclear cataract, bilateral: Secondary | ICD-10-CM | POA: Diagnosis not present

## 2023-05-05 DIAGNOSIS — H524 Presbyopia: Secondary | ICD-10-CM | POA: Diagnosis not present

## 2023-05-07 DIAGNOSIS — M5451 Vertebrogenic low back pain: Secondary | ICD-10-CM | POA: Diagnosis not present

## 2023-05-07 DIAGNOSIS — M21372 Foot drop, left foot: Secondary | ICD-10-CM | POA: Diagnosis not present

## 2023-05-12 DIAGNOSIS — Z6825 Body mass index (BMI) 25.0-25.9, adult: Secondary | ICD-10-CM | POA: Diagnosis not present

## 2023-05-12 DIAGNOSIS — M5416 Radiculopathy, lumbar region: Secondary | ICD-10-CM | POA: Diagnosis not present

## 2023-09-18 ENCOUNTER — Other Ambulatory Visit: Payer: Self-pay | Admitting: Primary Care

## 2023-11-14 DIAGNOSIS — Z23 Encounter for immunization: Secondary | ICD-10-CM | POA: Diagnosis not present

## 2023-12-24 DIAGNOSIS — S32010A Wedge compression fracture of first lumbar vertebra, initial encounter for closed fracture: Secondary | ICD-10-CM | POA: Diagnosis not present

## 2023-12-24 DIAGNOSIS — M5416 Radiculopathy, lumbar region: Secondary | ICD-10-CM | POA: Diagnosis not present

## 2023-12-24 DIAGNOSIS — Z9181 History of falling: Secondary | ICD-10-CM | POA: Diagnosis not present

## 2023-12-24 DIAGNOSIS — M21372 Foot drop, left foot: Secondary | ICD-10-CM | POA: Diagnosis not present

## 2024-01-08 DIAGNOSIS — I129 Hypertensive chronic kidney disease with stage 1 through stage 4 chronic kidney disease, or unspecified chronic kidney disease: Secondary | ICD-10-CM | POA: Diagnosis not present

## 2024-01-08 DIAGNOSIS — M810 Age-related osteoporosis without current pathological fracture: Secondary | ICD-10-CM | POA: Diagnosis not present

## 2024-01-08 DIAGNOSIS — E785 Hyperlipidemia, unspecified: Secondary | ICD-10-CM | POA: Diagnosis not present

## 2024-01-08 DIAGNOSIS — N1832 Chronic kidney disease, stage 3b: Secondary | ICD-10-CM | POA: Diagnosis not present

## 2024-01-08 DIAGNOSIS — D649 Anemia, unspecified: Secondary | ICD-10-CM | POA: Diagnosis not present

## 2024-01-08 DIAGNOSIS — M1990 Unspecified osteoarthritis, unspecified site: Secondary | ICD-10-CM | POA: Diagnosis not present

## 2024-01-08 DIAGNOSIS — Z1211 Encounter for screening for malignant neoplasm of colon: Secondary | ICD-10-CM | POA: Diagnosis not present

## 2024-01-08 DIAGNOSIS — R0901 Asphyxia: Secondary | ICD-10-CM | POA: Diagnosis not present

## 2024-01-08 DIAGNOSIS — M48061 Spinal stenosis, lumbar region without neurogenic claudication: Secondary | ICD-10-CM | POA: Diagnosis not present

## 2024-01-21 DIAGNOSIS — S32020D Wedge compression fracture of second lumbar vertebra, subsequent encounter for fracture with routine healing: Secondary | ICD-10-CM | POA: Diagnosis not present

## 2024-01-21 DIAGNOSIS — S32010A Wedge compression fracture of first lumbar vertebra, initial encounter for closed fracture: Secondary | ICD-10-CM | POA: Diagnosis not present

## 2024-02-01 ENCOUNTER — Encounter (HOSPITAL_BASED_OUTPATIENT_CLINIC_OR_DEPARTMENT_OTHER): Payer: Self-pay | Admitting: Pulmonary Disease

## 2024-02-03 ENCOUNTER — Other Ambulatory Visit: Payer: Self-pay | Admitting: Student

## 2024-02-03 DIAGNOSIS — M5416 Radiculopathy, lumbar region: Secondary | ICD-10-CM

## 2024-02-11 ENCOUNTER — Inpatient Hospital Stay: Admission: RE | Admit: 2024-02-11 | Discharge: 2024-02-11 | Attending: Student

## 2024-02-11 DIAGNOSIS — M5416 Radiculopathy, lumbar region: Secondary | ICD-10-CM

## 2024-02-11 DIAGNOSIS — M5126 Other intervertebral disc displacement, lumbar region: Secondary | ICD-10-CM | POA: Diagnosis not present

## 2024-03-09 ENCOUNTER — Other Ambulatory Visit
